# Patient Record
Sex: Male | Born: 1970 | Race: White | Hispanic: No | Marital: Married | Smoking: Current every day smoker
Health system: Southern US, Community
[De-identification: ages and names within clinical notes are randomized; demographics above are authoritative.]

## PROBLEM LIST (undated history)

## (undated) DIAGNOSIS — R079 Chest pain, unspecified: Secondary | ICD-10-CM

## (undated) DIAGNOSIS — K219 Gastro-esophageal reflux disease without esophagitis: Secondary | ICD-10-CM

## (undated) DIAGNOSIS — Z8719 Personal history of other diseases of the digestive system: Secondary | ICD-10-CM

## (undated) HISTORY — DX: Chest pain, unspecified: R07.9

## (undated) HISTORY — PX: FOOT SURGERY: SHX648

## (undated) HISTORY — DX: Gastro-esophageal reflux disease without esophagitis: K21.9

## (undated) HISTORY — PX: CHOLECYSTECTOMY: SHX55

---

## 1998-05-11 ENCOUNTER — Emergency Department (HOSPITAL_COMMUNITY): Admission: EM | Admit: 1998-05-11 | Discharge: 1998-05-11 | Payer: Self-pay | Admitting: Emergency Medicine

## 2001-05-19 ENCOUNTER — Ambulatory Visit (HOSPITAL_COMMUNITY): Admission: RE | Admit: 2001-05-19 | Discharge: 2001-05-19 | Payer: Self-pay | Admitting: Infectious Diseases

## 2001-05-19 ENCOUNTER — Encounter: Admission: RE | Admit: 2001-05-19 | Discharge: 2001-05-19 | Payer: Self-pay | Admitting: Infectious Diseases

## 2001-06-23 ENCOUNTER — Encounter: Admission: RE | Admit: 2001-06-23 | Discharge: 2001-06-23 | Payer: Self-pay | Admitting: Infectious Diseases

## 2005-03-22 ENCOUNTER — Emergency Department (HOSPITAL_COMMUNITY): Admission: AD | Admit: 2005-03-22 | Discharge: 2005-03-22 | Payer: Self-pay | Admitting: Family Medicine

## 2007-07-09 ENCOUNTER — Encounter: Payer: Self-pay | Admitting: Pulmonary Disease

## 2007-07-22 ENCOUNTER — Ambulatory Visit: Payer: Self-pay | Admitting: Pulmonary Disease

## 2007-07-22 DIAGNOSIS — R0602 Shortness of breath: Secondary | ICD-10-CM

## 2007-07-22 DIAGNOSIS — J45909 Unspecified asthma, uncomplicated: Secondary | ICD-10-CM | POA: Insufficient documentation

## 2008-06-27 ENCOUNTER — Encounter: Admission: RE | Admit: 2008-06-27 | Discharge: 2008-06-27 | Payer: Self-pay | Admitting: Family Medicine

## 2008-11-22 ENCOUNTER — Emergency Department (HOSPITAL_COMMUNITY): Admission: EM | Admit: 2008-11-22 | Discharge: 2008-11-22 | Payer: Self-pay | Admitting: Emergency Medicine

## 2010-01-24 ENCOUNTER — Ambulatory Visit (HOSPITAL_COMMUNITY): Admission: RE | Admit: 2010-01-24 | Discharge: 2010-01-24 | Payer: Self-pay | Admitting: Family Medicine

## 2010-07-10 ENCOUNTER — Institutional Professional Consult (permissible substitution): Payer: Self-pay | Admitting: Cardiology

## 2010-08-12 ENCOUNTER — Telehealth: Payer: Self-pay | Admitting: Cardiology

## 2010-08-12 NOTE — Telephone Encounter (Signed)
07/10/2010 NEEDS OFFICE NOTE AND ANY TESTING  Jeffery Thompson [phone]

## 2011-07-08 ENCOUNTER — Encounter (HOSPITAL_COMMUNITY): Payer: Self-pay | Admitting: *Deleted

## 2011-07-08 ENCOUNTER — Ambulatory Visit (HOSPITAL_COMMUNITY)
Admission: EM | Admit: 2011-07-08 | Discharge: 2011-07-09 | Disposition: A | Discharge status: Home / Self Care | Payer: BC Managed Care – PPO | Attending: Surgery | Admitting: Surgery

## 2011-07-08 ENCOUNTER — Emergency Department (HOSPITAL_COMMUNITY)
Admission: EM | Admit: 2011-07-08 | Discharge: 2011-07-08 | Disposition: A | Discharge status: Other Acute Inpatient Hospital | Payer: BC Managed Care – PPO | Source: Home / Self Care | Attending: Family Medicine | Admitting: Family Medicine

## 2011-07-08 ENCOUNTER — Emergency Department (HOSPITAL_COMMUNITY): Payer: BC Managed Care – PPO

## 2011-07-08 DIAGNOSIS — R109 Unspecified abdominal pain: Secondary | ICD-10-CM

## 2011-07-08 DIAGNOSIS — K449 Diaphragmatic hernia without obstruction or gangrene: Secondary | ICD-10-CM | POA: Insufficient documentation

## 2011-07-08 DIAGNOSIS — K3580 Unspecified acute appendicitis: Secondary | ICD-10-CM | POA: Insufficient documentation

## 2011-07-08 HISTORY — DX: Personal history of other diseases of the digestive system: Z87.19

## 2011-07-08 LAB — CBC
HCT: 52.1 % — ABNORMAL HIGH (ref 39.0–52.0)
Hemoglobin: 18.5 g/dL — ABNORMAL HIGH (ref 13.0–17.0)
MCV: 89.4 fL (ref 78.0–100.0)
RBC: 5.83 MIL/uL — ABNORMAL HIGH (ref 4.22–5.81)
WBC: 14.9 10*3/uL — ABNORMAL HIGH (ref 4.0–10.5)

## 2011-07-08 LAB — DIFFERENTIAL
Basophils Relative: 0 % (ref 0–1)
Eosinophils Absolute: 0.4 10*3/uL (ref 0.0–0.7)
Eosinophils Relative: 3 % (ref 0–5)
Neutrophils Relative %: 64 % (ref 43–77)

## 2011-07-08 LAB — POCT URINALYSIS DIP (DEVICE)
Bilirubin Urine: NEGATIVE
Ketones, ur: NEGATIVE mg/dL
Leukocytes, UA: NEGATIVE
Specific Gravity, Urine: 1.01 (ref 1.005–1.030)

## 2011-07-08 LAB — POCT I-STAT, CHEM 8
HCT: 54 % — ABNORMAL HIGH (ref 39.0–52.0)
Hemoglobin: 18.4 g/dL — ABNORMAL HIGH (ref 13.0–17.0)
Potassium: 3.8 mEq/L (ref 3.5–5.1)
Sodium: 140 mEq/L (ref 135–145)
TCO2: 25 mmol/L (ref 0–100)

## 2011-07-08 MED ORDER — IOHEXOL 300 MG/ML  SOLN
100.0000 mL | Freq: Once | INTRAMUSCULAR | Status: AC | PRN
  Administered 2011-07-08: 100 mL via INTRAVENOUS

## 2011-07-08 MED ORDER — HYDROMORPHONE HCL PF 1 MG/ML IJ SOLN
1.0000 mg | Freq: Once | INTRAMUSCULAR | Status: AC
  Administered 2011-07-08: 1 mg via INTRAVENOUS
  Filled 2011-07-08: qty 1

## 2011-07-08 MED ORDER — ONDANSETRON HCL 4 MG/2ML IJ SOLN
4.0000 mg | Freq: Once | INTRAMUSCULAR | Status: AC
  Administered 2011-07-08: 4 mg via INTRAVENOUS
  Filled 2011-07-08: qty 2

## 2011-07-08 NOTE — ED Notes (Signed)
Checked with pt. Raised bed for comfort measure ,family already had coffee . Everything ok right now waiting for md .Advised to let me know if they need anything.

## 2011-07-08 NOTE — ED Provider Notes (Signed)
History     CSN: [id number]  Arrival date & time 07/08/11  1423   First MD Initiated Contact with Patient 07/08/11 1523      Chief Complaint  Patient presents with  . Abdominal Pain    (Consider location/radiation/quality/duration/timing/severity/associated sxs/prior treatment) HPI Comments: Jeffery Thompson presents for evaluation of sudden onset of right lower quadrant abdominal pain. He reports onset of symptoms around 4:00 this morning. He reports nausea, but no vomiting. He had a normal bowel movement this morning. He denies any urinary symptoms. He states that the pain does not radiate. He has tolerated by mouth today. He is a history of a cholecystectomy. On examination at the urgent care Center. He is tended over McBurney's point with rebound tenderness. There is no increase in pain with activation of the iliopsoas.  Patient is a 41 y.o. male presenting with abdominal pain. The history is provided by the patient.  Abdominal Pain The primary symptoms of the illness include abdominal pain and nausea. The primary symptoms of the illness do not include fever, vomiting, diarrhea or dysuria. The current episode started 6 to 12 hours ago. The onset of the illness was sudden. The problem has not changed since onset. The abdominal pain began 6 to 12 hours ago. The pain came on suddenly. The abdominal pain has been unchanged since its onset. The abdominal pain is located in the RLQ. The abdominal pain does not radiate. The severity of the abdominal pain is 9/10. The abdominal pain is relieved by nothing.  Nausea began today.    Past Medical History  Diagnosis Date  . Low testosterone   . Hiatal hernia     Past Surgical History  Procedure Date  . Cholecystectomy     No family history on file.  History  Substance Use Topics  . Smoking status: Current Everyday Smoker  . Smokeless tobacco: Not on file  . Alcohol Use: Yes      Review of Systems  Constitutional: Negative.  Negative for  fever.  HENT: Negative.   Eyes: Negative.   Respiratory: Negative.   Cardiovascular: Negative.   Gastrointestinal: Positive for nausea and abdominal pain. Negative for vomiting and diarrhea.  Genitourinary: Negative.  Negative for dysuria.  Musculoskeletal: Negative.   Skin: Negative.   Neurological: Negative.     Allergies  Tetracycline  Home Medications   Current Outpatient Rx  Name Route Sig Dispense Refill  . LORATADINE 10 MG PO TABS Oral Take 10 mg by mouth daily.    Marland Kitchen OMEPRAZOLE 20 MG PO CPDR Oral Take 20 mg by mouth daily.    . TESTOSTERONE 50 MG/5GM TD GEL Transdermal Place 5 g onto the skin daily.      BP 148/92  Pulse 69  Temp(Src) 98.6 F (37 C) (Oral)  Resp 18  SpO2 98%  Physical Exam  Nursing note and vitals reviewed. Constitutional: He is oriented to person, place, and time. He appears well-developed and well-nourished.  HENT:  Head: Normocephalic and atraumatic.  Eyes: EOM are normal.  Neck: Normal range of motion.  Pulmonary/Chest: Effort normal.  Abdominal: Soft. Normal appearance and bowel sounds are normal. There is tenderness in the right lower quadrant. There is rebound and tenderness at McBurney's point. There is no rigidity and no guarding.  Musculoskeletal: Normal range of motion.  Neurological: He is alert and oriented to person, place, and time.  Skin: Skin is warm and dry.  Psychiatric: His behavior is normal.    ED Course  Procedures (including critical  care time)   Labs Reviewed  POCT URINALYSIS DIP (DEVICE)   No results found.   1. Abdominal pain       MDM  Transferred to [facility] Emergency Department to rule out appendicitis.        Richardo Priest, MD 07/08/11 [phone]

## 2011-07-08 NOTE — ED Notes (Signed)
Pt  Reports  r  sided  abd  Pain      Onset  This  Am      Had  Normal  bm today        Walking  Upright  With  Fluid  Gait          denys  Any  Urinary  Symptoms        Skin is  Warm  Dry  No  Active  Vomiting  At this  Time

## 2011-07-08 NOTE — ED Notes (Signed)
NP at bedside.

## 2011-07-08 NOTE — ED Notes (Signed)
Patient reports onset of right sided pain that started this morning and has increased during the day.  Patient was sent for further eval of possible appendicitis

## 2011-07-08 NOTE — ED Provider Notes (Signed)
History     CSN: [id number]  Arrival date & time 07/08/11  1830   First MD Initiated Contact with Patient 07/08/11 2057      Chief Complaint  Patient presents with  . Abdominal Pain    (Consider location/radiation/quality/duration/timing/severity/associated sxs/prior treatment) HPI Comments: The patient woke this morning with right lower and suprapubic pain, which has progressed throughout the day with nausea one loose stool.   To an urgent care they did a urine and sent him here after his physical exam, they did not perform any lab values  Patient is a 41 y.o. male presenting with abdominal pain. The history is provided by the patient.  Abdominal Pain The primary symptoms of the illness include abdominal pain and nausea. The primary symptoms of the illness do not include fever, vomiting or dysuria. The current episode started 13 to 24 hours ago. The onset of the illness was gradual. The problem has been gradually worsening.  Symptoms associated with the illness do not include chills, anorexia, constipation, urgency or frequency.    Past Medical History  Diagnosis Date  . Low testosterone   . Hiatal hernia     Past Surgical History  Procedure Date  . Cholecystectomy     No family history on file.  History  Substance Use Topics  . Smoking status: Current Everyday Smoker  . Smokeless tobacco: Not on file  . Alcohol Use: Yes      Review of Systems  Constitutional: Negative for fever and chills.  Gastrointestinal: Positive for nausea and abdominal pain. Negative for vomiting, constipation, rectal pain and anorexia.  Genitourinary: Negative for dysuria, urgency and frequency.  Neurological: Negative for dizziness.    Allergies  Tetracycline  Home Medications   Current Outpatient Rx  Name Route Sig Dispense Refill  . CLOBETASOL PROPIONATE 0.05 % EX CREA Topical Apply 1 application topically 2 (two) times daily.    Marland Kitchen LORATADINE 10 MG PO TABS Oral Take 10 mg by  mouth daily as needed. For seasonal allergies    . OMEPRAZOLE 20 MG PO CPDR Oral Take 20 mg by mouth daily before breakfast.     . TESTOSTERONE 50 MG/5GM TD GEL Transdermal Place 5 g onto the skin daily. Applies to shoulders      BP 131/82  Pulse 74  Temp(Src) 98.4 F (36.9 C) (Oral)  Resp 16  Ht 5\' 9"  (1.753 m)  Wt 203 lb (92.08 kg)  BMI 29.98 kg/m2  SpO2 97%  Physical Exam  Constitutional: He is oriented to person, place, and time. He appears well-developed and well-nourished.  Eyes: Pupils are equal, round, and reactive to light.  Cardiovascular: Normal rate.   Abdominal: He exhibits no distension and no mass. There is tenderness. There is no guarding.    Musculoskeletal: Normal range of motion.  Neurological: He is alert and oriented to person, place, and time.  Skin: Skin is warm and dry.    ED Course  Procedures (including critical care time)  Labs Reviewed  CBC - Abnormal; Notable for the following:    WBC 14.9 (*)    RBC 5.83 (*)    Hemoglobin 18.5 (*)    HCT 52.1 (*)    All other components within normal limits  DIFFERENTIAL - Abnormal; Notable for the following:    Neutro Abs 9.5 (*)    Monocytes Absolute 1.2 (*)    All other components within normal limits  POCT I-STAT, CHEM 8 - Abnormal; Notable for the following:  Hemoglobin 18.4 (*)    HCT 54.0 (*)    All other components within normal limits   Ct Abdomen Pelvis W Contrast  07/09/2011  *RADIOLOGY REPORT*  Clinical Data: Right lower quadrant pain. Elevated white blood count.  CT ABDOMEN AND PELVIS WITH CONTRAST  Technique:  Multidetector CT imaging of the abdomen and pelvis was performed following the standard protocol during bolus administration of intravenous contrast.  Contrast:  100 ml Omnipaque-300  Comparison: None.  Findings: Lung bases are clear.  No evidence for free air. Decreased attenuation of the liver suggests hepatic steatosis.  The portal venous system is patent.  Normal appearance of the  kidneys, adrenal glands and spleen.  No significant free fluid or lymphadenopathy in the abdomen or pelvis.  No gross abnormality to the prostate or seminal vesicles.  Urinary bladder is mildly distended.  The appendix is mildly distended measuring up to 9 mm.  The appendix is most distended near the tip.  There is mild stranding around the appendix, best seen on the sagittal images.  The distal appendix appears to be fluid filled.  No acute bony abnormality.  IMPRESSION:  The appendix is mildly distended and there are subtle inflammatory changes around the appendix.  Findings are concerning for an early appendicitis.  Critical Value/emergent results were called by telephone at the time of interpretation on 07/09/2011  at 12:15 a.m.  to  Earley Favor, who verbally acknowledged these results.  Original Report Authenticated By: Richarda Overlie, M.D.     1. Acute appendicitis       MDM  Will obtain labs including CBC and i-STAT provide pain management and obtain a CT scan to evaluate for right lower quadrant pain concerning for appendicitis  Radiologist called confirming early acute appendicitis      Arman Filter, NP 07/08/11 2237  Arman Filter, NP 07/09/11 0017  Arman Filter, NP 07/09/11 1610

## 2011-07-08 NOTE — Discharge Instructions (Signed)
Transferred to [facility] Emergency Department to rule out appendicitis.

## 2011-07-09 ENCOUNTER — Encounter (HOSPITAL_COMMUNITY): Payer: Self-pay | Admitting: Certified Registered Nurse Anesthetist

## 2011-07-09 ENCOUNTER — Emergency Department (HOSPITAL_COMMUNITY): Payer: BC Managed Care – PPO | Admitting: Certified Registered Nurse Anesthetist

## 2011-07-09 ENCOUNTER — Encounter (HOSPITAL_COMMUNITY): Payer: Self-pay | Admitting: *Deleted

## 2011-07-09 ENCOUNTER — Encounter (HOSPITAL_COMMUNITY)
Admission: EM | Disposition: A | Discharge status: Home / Self Care | Payer: Self-pay | Source: Home / Self Care | Attending: Emergency Medicine

## 2011-07-09 DIAGNOSIS — K3580 Unspecified acute appendicitis: Secondary | ICD-10-CM

## 2011-07-09 DIAGNOSIS — R112 Nausea with vomiting, unspecified: Secondary | ICD-10-CM

## 2011-07-09 DIAGNOSIS — R1031 Right lower quadrant pain: Secondary | ICD-10-CM

## 2011-07-09 HISTORY — PX: LAPAROSCOPIC APPENDECTOMY: SHX408

## 2011-07-09 LAB — CBC
HCT: 46.3 % (ref 39.0–52.0)
Hemoglobin: 15.9 g/dL (ref 13.0–17.0)
MCHC: 34.3 g/dL (ref 30.0–36.0)
RBC: 5.15 MIL/uL (ref 4.22–5.81)

## 2011-07-09 LAB — CREATININE, SERUM
Creatinine, Ser: 0.96 mg/dL (ref 0.50–1.35)
GFR calc Af Amer: >90 mL/min (ref >90)
GFR calc non Af Amer: >90 mL/min (ref >90)

## 2011-07-09 SURGERY — APPENDECTOMY, LAPAROSCOPIC
Anesthesia: General | Site: Abdomen | Wound class: Clean

## 2011-07-09 MED ORDER — CLOBETASOL PROPIONATE 0.05 % EX CREA
1.0000 "application " | TOPICAL_CREAM | Freq: Two times a day (BID) | CUTANEOUS | Status: DC
  Filled 2011-07-09: qty 15

## 2011-07-09 MED ORDER — ACETAMINOPHEN 325 MG PO TABS: 650.0000 mg | ORAL_TABLET | ORAL | Status: DC | PRN

## 2011-07-09 MED ORDER — ROCURONIUM BROMIDE 100 MG/10ML IV SOLN
INTRAVENOUS | Status: DC | PRN
  Administered 2011-07-09: 50 mg via INTRAVENOUS

## 2011-07-09 MED ORDER — KCL IN DEXTROSE-NACL 20-5-0.45 MEQ/L-%-% IV SOLN
INTRAVENOUS | Status: DC
  Administered 2011-07-09: 06:00:00 via INTRAVENOUS
  Filled 2011-07-09 (×2): qty 1000

## 2011-07-09 MED ORDER — MIDAZOLAM HCL 5 MG/5ML IJ SOLN
INTRAMUSCULAR | Status: DC | PRN
  Administered 2011-07-09: 2 mg via INTRAVENOUS

## 2011-07-09 MED ORDER — PANTOPRAZOLE SODIUM 40 MG PO TBEC
40.0000 mg | DELAYED_RELEASE_TABLET | Freq: Every day | ORAL | Status: DC
  Administered 2011-07-09: 40 mg via ORAL
  Filled 2011-07-09: qty 1

## 2011-07-09 MED ORDER — HYDROCODONE-ACETAMINOPHEN 5-325 MG PO TABS: 1.0000 | ORAL_TABLET | ORAL | Status: AC | PRN

## 2011-07-09 MED ORDER — KETOROLAC TROMETHAMINE 30 MG/ML IJ SOLN
30.0000 mg | Freq: Four times a day (QID) | INTRAMUSCULAR | Status: DC
  Administered 2011-07-09: 30 mg via INTRAVENOUS
  Filled 2011-07-09 (×3): qty 1

## 2011-07-09 MED ORDER — HYDROMORPHONE HCL PF 1 MG/ML IJ SOLN
0.2500 mg | INTRAMUSCULAR | Status: DC | PRN
  Administered 2011-07-09: 0.5 mg via INTRAVENOUS

## 2011-07-09 MED ORDER — TESTOSTERONE 50 MG/5GM (1%) TD GEL: 5.0000 g | Freq: Every day | TRANSDERMAL | Status: DC

## 2011-07-09 MED ORDER — SODIUM CHLORIDE 0.9 % IV SOLN
1.0000 g | INTRAVENOUS | Status: AC
  Administered 2011-07-09: 1 g via INTRAVENOUS
  Filled 2011-07-09: qty 1

## 2011-07-09 MED ORDER — EPHEDRINE SULFATE 50 MG/ML IJ SOLN
INTRAMUSCULAR | Status: DC | PRN
  Administered 2011-07-09: 5 mg via INTRAVENOUS

## 2011-07-09 MED ORDER — PROPOFOL 10 MG/ML IV EMUL
INTRAVENOUS | Status: DC | PRN
  Administered 2011-07-09: 200 mg via INTRAVENOUS

## 2011-07-09 MED ORDER — MEPERIDINE HCL 25 MG/ML IJ SOLN: 6.2500 mg | INTRAMUSCULAR | Status: DC | PRN

## 2011-07-09 MED ORDER — MORPHINE SULFATE 2 MG/ML IJ SOLN
1.0000 mg | INTRAMUSCULAR | Status: DC | PRN
  Administered 2011-07-09: 1 mg via INTRAVENOUS
  Filled 2011-07-09: qty 1

## 2011-07-09 MED ORDER — ONDANSETRON HCL 4 MG/2ML IJ SOLN: 4.0000 mg | Freq: Once | INTRAMUSCULAR | Status: DC | PRN

## 2011-07-09 MED ORDER — MORPHINE SULFATE 4 MG/ML IJ SOLN: 0.0500 mg/kg | INTRAMUSCULAR | Status: DC | PRN

## 2011-07-09 MED ORDER — ONDANSETRON HCL 4 MG/2ML IJ SOLN
INTRAMUSCULAR | Status: DC | PRN
  Administered 2011-07-09: 4 mg via INTRAVENOUS

## 2011-07-09 MED ORDER — NEOSTIGMINE METHYLSULFATE 1 MG/ML IJ SOLN
INTRAMUSCULAR | Status: DC | PRN
  Administered 2011-07-09: 5 mg via INTRAVENOUS

## 2011-07-09 MED ORDER — FENTANYL CITRATE 0.05 MG/ML IJ SOLN
INTRAMUSCULAR | Status: DC | PRN
  Administered 2011-07-09: 150 ug via INTRAVENOUS

## 2011-07-09 MED ORDER — GLYCOPYRROLATE 0.2 MG/ML IJ SOLN
INTRAMUSCULAR | Status: DC | PRN
  Administered 2011-07-09: .8 mg via INTRAVENOUS

## 2011-07-09 MED ORDER — BUPIVACAINE-EPINEPHRINE 0.25% -1:200000 IJ SOLN
INTRAMUSCULAR | Status: DC | PRN
  Administered 2011-07-09: 7 mL

## 2011-07-09 MED ORDER — HYDROCODONE-ACETAMINOPHEN 5-325 MG PO TABS
1.0000 | ORAL_TABLET | ORAL | Status: DC | PRN
  Administered 2011-07-09: 2 via ORAL
  Filled 2011-07-09: qty 2

## 2011-07-09 MED ORDER — HYDROMORPHONE HCL PF 1 MG/ML IJ SOLN
1.0000 mg | Freq: Once | INTRAMUSCULAR | Status: DC
  Filled 2011-07-09: qty 1

## 2011-07-09 MED ORDER — ENOXAPARIN SODIUM 40 MG/0.4ML ~~LOC~~ SOLN
40.0000 mg | Freq: Every day | SUBCUTANEOUS | Status: DC
  Administered 2011-07-09: 40 mg via SUBCUTANEOUS
  Filled 2011-07-09: qty 0.4

## 2011-07-09 MED ORDER — LIDOCAINE HCL (CARDIAC) 20 MG/ML IV SOLN
INTRAVENOUS | Status: DC | PRN
  Administered 2011-07-09: 100 mg via INTRAVENOUS

## 2011-07-09 MED ORDER — LORATADINE 10 MG PO TABS
10.0000 mg | ORAL_TABLET | Freq: Every day | ORAL | Status: DC | PRN
  Filled 2011-07-09: qty 1

## 2011-07-09 MED ORDER — OXYCODONE-ACETAMINOPHEN 5-325 MG PO TABS
1.0000 | ORAL_TABLET | ORAL | Status: DC | PRN
  Administered 2011-07-09: 2 via ORAL
  Filled 2011-07-09: qty 2

## 2011-07-09 MED ORDER — LACTATED RINGERS IV SOLN
INTRAVENOUS | Status: DC | PRN
  Administered 2011-07-09: 02:00:00 via INTRAVENOUS

## 2011-07-09 SURGICAL SUPPLY — 39 items
APPLIER CLIP ROT 10 11.4 M/L (STAPLE)
BLADE SURG ROTATE 9660 (MISCELLANEOUS) IMPLANT
CANISTER SUCTION 2500CC (MISCELLANEOUS) ×2 IMPLANT
CHLORAPREP W/TINT 26ML (MISCELLANEOUS) ×2 IMPLANT
CLIP APPLIE ROT 10 11.4 M/L (STAPLE) IMPLANT
CLOTH BEACON ORANGE TIMEOUT ST (SAFETY) ×2 IMPLANT
COVER SURGICAL LIGHT HANDLE (MISCELLANEOUS) ×2 IMPLANT
CUTTER FLEX LINEAR 45M (STAPLE) ×2 IMPLANT
DERMABOND ADVANCED (GAUZE/BANDAGES/DRESSINGS) ×1
DERMABOND ADVANCED .7 DNX12 (GAUZE/BANDAGES/DRESSINGS) ×1 IMPLANT
DRAPE UTILITY 15X26 W/TAPE STR (DRAPE) ×4 IMPLANT
DRAPE WARM FLUID 44X44 (DRAPE) ×2 IMPLANT
ELECT REM PT RETURN 9FT ADLT (ELECTROSURGICAL) ×2
ELECTRODE REM PT RTRN 9FT ADLT (ELECTROSURGICAL) ×1 IMPLANT
ENDOLOOP SUT PDS II  0 18 (SUTURE)
ENDOLOOP SUT PDS II 0 18 (SUTURE) IMPLANT
GLOVE BIO SURGEON STRL SZ 6 (GLOVE) ×2 IMPLANT
GLOVE BIOGEL PI IND STRL 6.5 (GLOVE) ×1 IMPLANT
GLOVE BIOGEL PI INDICATOR 6.5 (GLOVE) ×1
GOWN PREVENTION PLUS XXLARGE (GOWN DISPOSABLE) ×4 IMPLANT
GOWN STRL NON-REIN LRG LVL3 (GOWN DISPOSABLE) ×4 IMPLANT
KIT BASIN OR (CUSTOM PROCEDURE TRAY) ×2 IMPLANT
KIT ROOM TURNOVER OR (KITS) ×2 IMPLANT
NS IRRIG 1000ML POUR BTL (IV SOLUTION) ×2 IMPLANT
PAD ARMBOARD 7.5X6 YLW CONV (MISCELLANEOUS) ×4 IMPLANT
POUCH SPECIMEN RETRIEVAL 10MM (ENDOMECHANICALS) ×2 IMPLANT
RELOAD STAPLE TA45 3.5 REG BLU (ENDOMECHANICALS) ×2 IMPLANT
SCALPEL HARMONIC ACE (MISCELLANEOUS) ×2 IMPLANT
SET IRRIG TUBING LAPAROSCOPIC (IRRIGATION / IRRIGATOR) ×2 IMPLANT
SLEEVE ENDOPATH XCEL 5M (ENDOMECHANICALS) ×2 IMPLANT
SPECIMEN JAR SMALL (MISCELLANEOUS) ×2 IMPLANT
SUT MNCRL AB 4-0 PS2 18 (SUTURE) ×2 IMPLANT
TOWEL OR 17X24 6PK STRL BLUE (TOWEL DISPOSABLE) ×2 IMPLANT
TOWEL OR 17X26 10 PK STRL BLUE (TOWEL DISPOSABLE) ×4 IMPLANT
TRAY FOLEY CATH 14FR (SET/KITS/TRAYS/PACK) ×2 IMPLANT
TRAY LAPAROSCOPIC (CUSTOM PROCEDURE TRAY) ×2 IMPLANT
TROCAR XCEL BLUNT TIP 100MML (ENDOMECHANICALS) ×2 IMPLANT
TROCAR XCEL NON-BLD 5MMX100MML (ENDOMECHANICALS) ×2 IMPLANT
WATER STERILE IRR 1000ML POUR (IV SOLUTION) IMPLANT

## 2011-07-09 NOTE — Progress Notes (Signed)
Day of Surgery  Subjective: Eating clears, and ordering Bagel. Feels fine this AM.  Just took PO pain med.   Objective: Vital signs in last 24 hours: Temp:  [97.4 F (36.3 C)-98.6 F (37 C)] 98 F (36.7 C) (02/27 0555) Pulse Rate:  [69-102] 78  (02/27 0555) Resp:  [14-20] 18  (02/27 0555) BP: (113-154)/(56-96) 113/56 mmHg (02/27 0555) SpO2:  [94 %-98 %] 96 % (02/27 0555) Weight:  [92.08 kg (203 lb)] 92.08 kg (203 lb) (02/26 1845) Last BM Date: 07/08/11  Intake/Output from previous day: 02/26 0701 - 02/27 0700 In: 1660 [P.O.:360; I.V.:1300] Out: 605 [Urine:450; Blood:5] Intake/Output this shift:    PE:  Alert, Chest: clear  ABD:  Still somewhat tender, +BS, Incisions look good.   Lab Results:   Basename 07/09/11 0630 07/08/11 2124 07/08/11 2105  WBC 12.7* -- 14.9*  HGB 15.9 18.4* --  HCT 46.3 54.0* --  PLT 174 -- 205    BMET  Basename 07/09/11 0630 07/08/11 2124  NA -- 140  K -- 3.8  CL -- 103  CO2 -- --  GLUCOSE -- 90  BUN -- 9  CREATININE 0.96 0.90  CALCIUM -- --   PT/INR No results found for this basename: LABPROT:2,INR:2 in the last 72 hours   Studies/Results: Ct Abdomen Pelvis W Contrast  07/09/2011  *RADIOLOGY REPORT*  Clinical Data: Right lower quadrant pain. Elevated white blood count.  CT ABDOMEN AND PELVIS WITH CONTRAST  Technique:  Multidetector CT imaging of the abdomen and pelvis was performed following the standard protocol during bolus administration of intravenous contrast.  Contrast:  100 ml Omnipaque-300  Comparison: None.  Findings: Lung bases are clear.  No evidence for free air. Decreased attenuation of the liver suggests hepatic steatosis.  The portal venous system is patent.  Normal appearance of the kidneys, adrenal glands and spleen.  No significant free fluid or lymphadenopathy in the abdomen or pelvis.  No gross abnormality to the prostate or seminal vesicles.  Urinary bladder is mildly distended.  The appendix is mildly distended  measuring up to 9 mm.  The appendix is most distended near the tip.  There is mild stranding around the appendix, best seen on the sagittal images.  The distal appendix appears to be fluid filled.  No acute bony abnormality.  IMPRESSION:  The appendix is mildly distended and there are subtle inflammatory changes around the appendix.  Findings are concerning for an early appendicitis.  Critical Value/emergent results were called by telephone at the time of interpretation on 07/09/2011  at 12:15 a.m.  to  Earley Favor, who verbally acknowledged these results.  Original Report Authenticated By: Richarda Overlie, M.D.    Anti-infectives: Anti-infectives     Start     Dose/Rate Route Frequency Ordered Stop   07/09/11 0200   ertapenem (INVANZ) 1 g in sodium chloride 0.9 % 50 mL IVPB        1 g 100 mL/hr over 30 Minutes Intravenous To Surgery 07/09/11 0118 07/09/11 0215         Current Facility-Administered Medications  Medication Dose Route Frequency Provider Last Rate Last Dose  . acetaminophen (TYLENOL) tablet 650 mg  650 mg Oral Q4H PRN Almond Lint, MD      . clobetasol cream (TEMOVATE) 0.05 % 1 application  1 application Topical BID Almond Lint, MD      . dextrose 5 % and 0.45 % NaCl with KCl 20 mEq/L infusion   Intravenous Continuous Almond Lint, MD 100  mL/hr at 07/09/11 9562    . enoxaparin (LOVENOX) injection 40 mg  40 mg Subcutaneous Daily Almond Lint, MD      . ertapenem [facility]) 1 g in sodium chloride 0.9 % 50 mL IVPB  1 g Intravenous To OR Almond Lint, MD   1 g at 07/09/11 0215  . HYDROcodone-acetaminophen (NORCO) 5-325 MG per tablet 1-2 tablet  1-2 tablet Oral Q4H PRN Almond Lint, MD   2 tablet at 07/09/11 0804  . HYDROmorphone (DILAUDID) injection 1 mg  1 mg Intravenous Once Arman Filter, NP   1 mg at 07/08/11 2109  . HYDROmorphone (DILAUDID) injection 1 mg  1 mg Intravenous Once Arman Filter, NP   1 mg at 07/08/11 2228  . iohexol (OMNIPAQUE) 300 MG/ML solution 100 mL  100 mL  Intravenous Once PRN Medication Radiologist, MD   100 mL at 07/08/11 2355  . ketorolac (TORADOL) 30 MG/ML injection 30 mg  30 mg Intravenous Q6H Almond Lint, MD   30 mg at 07/09/11 0326  . loratadine (CLARITIN) tablet 10 mg  10 mg Oral Daily PRN Almond Lint, MD      . morphine 2 MG/ML injection 1-2 mg  1-2 mg Intravenous Q2H PRN Almond Lint, MD   1 mg at 07/09/11 0443  . ondansetron (ZOFRAN) injection 4 mg  4 mg Intravenous Once Arman Filter, NP   4 mg at 07/08/11 2109  . pantoprazole (PROTONIX) EC tablet 40 mg  40 mg Oral Q1200 Almond Lint, MD   40 mg at 07/09/11 0759  . testosterone (ANDROGEL) gel 5 g  5 g Transdermal Daily Almond Lint, MD      . DISCONTD: bupivacaine-EPINEPHrine (MARCAINE W/ EPI) 0.25 % (with pres) injection    PRN Almond Lint, MD   7 mL at 07/09/11 0258  . DISCONTD: HYDROmorphone (DILAUDID) injection 0.25-0.5 mg  0.25-0.5 mg Intravenous Q5 min PRN Kerby Nora, MD   0.5 mg at 07/09/11 0330  . DISCONTD: HYDROmorphone (DILAUDID) injection 1 mg  1 mg Intravenous Once Arman Filter, NP      . DISCONTD: meperidine (DEMEROL) injection 6.25-12.5 mg  6.25-12.5 mg Intravenous Q5 min PRN Kerby Nora, MD      . DISCONTD: morphine 4 MG/ML injection 4.6 mg  0.05 mg/kg Intravenous Q10 min PRN Kerby Nora, MD      . DISCONTD: ondansetron [facility]) injection 4 mg  4 mg Intravenous Once PRN Kerby Nora, MD       Facility-Administered Medications Ordered in Other Encounters  Medication Dose Route Frequency Provider Last Rate Last Dose  . DISCONTD: ePHEDrine injection    PRN Delbert Harness, CRNA   5 mg at 07/09/11 0215  . DISCONTD: fentaNYL (SUBLIMAZE) injection    PRN Delbert Harness, CRNA   150 mcg at 07/09/11 0158  . DISCONTD: glycopyrrolate (ROBINUL) injection    PRN Delbert Harness, CRNA   0.8 mg at 07/09/11 0251  . DISCONTD: lactated ringers infusion    Continuous PRN Delbert Harness, CRNA      . DISCONTD: lidocaine (cardiac) 100 mg/35ml (XYLOCAINE) 20 MG/ML injection 2%    PRN  Delbert Harness, CRNA   100 mg at 07/09/11 0159  . DISCONTD: midazolam (VERSED) 5 MG/5ML injection    PRN Delbert Harness, CRNA   2 mg at 07/09/11 0154  . DISCONTD: neostigmine (PROSTIGMINE) injection   Intravenous PRN Delbert Harness, CRNA   5 mg at 07/09/11 0251  .  DISCONTD: ondansetron (ZOFRAN) injection    PRN Delbert Harness, CRNA   4 mg at 07/09/11 0154  . DISCONTD: propofol (DIPRIVAN) 10 MG/ML infusion    PRN Delbert Harness, CRNA   200 mg at 07/09/11 0159  . DISCONTD: rocuronium (ZEMURON) injection    PRN Delbert Harness, CRNA   50 mg at 07/09/11 0159    Assessment/Plan Acute appendicitis without mention of peritonitis s/p Appendectomy, Lap 07/09/11 DR.  Byerly Low testosterone  .  Hiatal hernia   PLan:  Advance diet, mobilize and may get home later today.    LOS: 1 day    Laniqua Torrens 07/09/2011

## 2011-07-09 NOTE — H&P (Signed)
Jeffery Thompson is an 41 y.o. male.   Chief Complaint: Abdominal pain HPI:  Pt is 41 year old male who awoke with right sided abdominal pain.  He thought it was gas, but it gradually worsened over the course of the day.  He denies anorexia, nausea, vomiting, diarrhea.  He denies fever/ chills.  He has not had pain like this before.  It hurts worse when he moves.   Past Medical History  Diagnosis Date  . Low testosterone   . Hiatal hernia     Past Surgical History  Procedure Date  . Cholecystectomy     No family history on file. Social History:  reports that he has been smoking.  He does not have any smokeless tobacco history on file. He reports that he drinks alcohol. He reports that he does not use illicit drugs.  Allergies:  Allergies  Allergen Reactions  . Tetracycline     REACTION: rash    Prescriptions Show Facility-Administered Medications   clobetasol cream (TEMOVATE) 0.05 %  loratadine (CLARITIN) 10 MG tablet  omeprazole (PRILOSEC) 20 MG capsule  testosterone (ANDROGEL) 50 MG/5GM GEL     Results for orders placed during the hospital encounter of 07/08/11 (from the past 48 hour(s))  CBC     Status: Abnormal   Collection Time   07/08/11  9:05 PM      Component Value Range Comment   WBC 14.9 (*) 4.0 - 10.5 (K/uL)    RBC 5.83 (*) 4.22 - 5.81 (MIL/uL)    Hemoglobin 18.5 (*) 13.0 - 17.0 (g/dL)    HCT 16.1 (*) 09.6 - 52.0 (%)    MCV 89.4  78.0 - 100.0 (fL)    MCH 31.7  26.0 - 34.0 (pg)    MCHC 35.5  30.0 - 36.0 (g/dL)    RDW 04.5  40.9 - 81.1 (%)    Platelets 205  150 - 400 (K/uL)   DIFFERENTIAL     Status: Abnormal   Collection Time   07/08/11  9:05 PM      Component Value Range Comment   Neutrophils Relative 64  43 - 77 (%)    Neutro Abs 9.5 (*) 1.7 - 7.7 (K/uL)    Lymphocytes Relative 25  12 - 46 (%)    Lymphs Abs 3.7  0.7 - 4.0 (K/uL)    Monocytes Relative 8  3 - 12 (%)    Monocytes Absolute 1.2 (*) 0.1 - 1.0 (K/uL)    Eosinophils Relative 3  0 - 5 (%)     Eosinophils Absolute 0.4  0.0 - 0.7 (K/uL)    Basophils Relative 0  0 - 1 (%)    Basophils Absolute 0.1  0.0 - 0.1 (K/uL)   POCT I-STAT, CHEM 8     Status: Abnormal   Collection Time   07/08/11  9:24 PM      Component Value Range Comment   Sodium 140  135 - 145 (mEq/L)    Potassium 3.8  3.5 - 5.1 (mEq/L)    Chloride 103  96 - 112 (mEq/L)    BUN 9  6 - 23 (mg/dL)    Creatinine, Ser 9.14  0.50 - 1.35 (mg/dL)    Glucose, Bld 90  70 - 99 (mg/dL)    Calcium, Ion 7.82  1.12 - 1.32 (mmol/L)    TCO2 25  0 - 100 (mmol/L)    Hemoglobin 18.4 (*) 13.0 - 17.0 (g/dL)    HCT 95.6 (*) 21.3 - 52.0 (%)  Ct Abdomen Pelvis W Contrast  07/09/2011  *RADIOLOGY REPORT*  Clinical Data: Right lower quadrant pain. Elevated white blood count.  CT ABDOMEN AND PELVIS WITH CONTRAST  Technique:  Multidetector CT imaging of the abdomen and pelvis was performed following the standard protocol during bolus administration of intravenous contrast.  Contrast:  100 ml Omnipaque-300  Comparison: None.  Findings: Lung bases are clear.  No evidence for free air. Decreased attenuation of the liver suggests hepatic steatosis.  The portal venous system is patent.  Normal appearance of the kidneys, adrenal glands and spleen.  No significant free fluid or lymphadenopathy in the abdomen or pelvis.  No gross abnormality to the prostate or seminal vesicles.  Urinary bladder is mildly distended.  The appendix is mildly distended measuring up to 9 mm.  The appendix is most distended near the tip.  There is mild stranding around the appendix, best seen on the sagittal images.  The distal appendix appears to be fluid filled.  No acute bony abnormality.  IMPRESSION:  The appendix is mildly distended and there are subtle inflammatory changes around the appendix.  Findings are concerning for an early appendicitis.  Critical Value/emergent results were called by telephone at the time of interpretation on 07/09/2011  at 12:15 a.m.  to  Earley Favor,  who verbally acknowledged these results.  Original Report Authenticated By: Richarda Overlie, M.D.    Review of Systems  Constitutional: Negative.   HENT: Negative.   Eyes: Negative.   Respiratory: Negative.   Cardiovascular: Negative.   Gastrointestinal: Positive for abdominal pain. Negative for heartburn, nausea, vomiting, diarrhea and constipation.  Genitourinary: Negative.   Musculoskeletal: Negative.   Skin: Negative.   Neurological: Negative.   Endo/Heme/Allergies: Negative.   Psychiatric/Behavioral: Negative.     Blood pressure 125/78, pulse 80, temperature 97.4 F (36.3 C), temperature source Oral, resp. rate 16, height 5\' 9"  (1.753 m), weight 203 lb (92.08 kg), SpO2 94.00%. Physical Exam  Constitutional: He is oriented to person, place, and time. He appears well-developed and well-nourished. No distress.  HENT:  Head: Normocephalic and atraumatic.  Mouth/Throat: No oropharyngeal exudate.  Eyes: Conjunctivae are normal. Pupils are equal, round, and reactive to light. No scleral icterus.  Neck: Normal range of motion. Neck supple. No thyromegaly present.  Cardiovascular: Normal rate, regular rhythm, normal heart sounds and intact distal pulses.  Exam reveals no gallop and no friction rub.   No murmur heard. Respiratory: Effort normal and breath sounds normal. No respiratory distress. He has no wheezes. He has no rales. He exhibits no tenderness.  GI: Soft. He exhibits distension (mildly distended). He exhibits no mass. There is tenderness (RLQ tenderness). There is no rebound and no guarding.  Musculoskeletal: Normal range of motion. He exhibits no edema and no tenderness.  Lymphadenopathy:    He has no cervical adenopathy.  Neurological: He is alert and oriented to person, place, and time. Coordination normal.  Skin: Skin is warm and dry. No rash noted. He is not diaphoretic. No erythema. No pallor.  Psychiatric: He has a normal mood and affect. His behavior is normal. Judgment  and thought content normal.     Assessment/Plan Acute appendicitis.  NPO   IVF  Invanz  To OR for lap appy  Appendectomy was described to the patient.  The incisions and surgical technique were explained.  The patient was advised that some of the hair on the abdomen would be clipped, and that a foley catheter would be placed.  I advised the patient  of the risks of surgery including, but not limited to, bleeding, infection, damage to other structures, risk of an open operation, risk of abscess, and risk of blood clot.  The recovery was also described to the patient.  He was advised that he will have lifting restrictions for 2 weeks.     Rabon Scholle 07/09/2011, 1:18 AM

## 2011-07-09 NOTE — Progress Notes (Signed)
[facility] ([facility]) - ATTENDING: Discussed with Dr. Donell Beers.  As above. Velora Heckler, MD, [facility] Office: [phone]

## 2011-07-09 NOTE — Discharge Summary (Signed)
Physician Discharge Summary  Patient ID: JAHMANI STAUP MRN: [id number] DOB/AGE: [birth year] 41 y.o.  Admit date: 07/08/2011 Discharge date: 07/09/2011  Admission Diagnoses: Acute appendicitis without mention of peritonitis     Discharge Diagnoses: Same   PROCEDURES: Acute appendicitis without mention of peritonitis   Hospital Course: Pt is 41 year old male who awoke with right sided abdominal pain. He thought it was gas, but it gradually worsened over the course of the day. He denies anorexia, nausea, vomiting, diarrhea. He denies fever/ chills. He has not had pain like this before. It hurts worse when he moves.  He underwent surgery the evening of admission and did well.  He was discharged the following afternoon.  Disposition: home  Condition on D/C: Improved   Medication List  As of 07/09/2011 12:31 PM   TAKE these medications         acetaminophen 325 MG tablet   Commonly known as: TYLENOL   Take 2 tablets (650 mg total) by mouth every 4 (four) hours as needed.      clobetasol cream 0.05 %   Commonly known as: TEMOVATE   Apply 1 application topically 2 (two) times daily.      HYDROcodone-acetaminophen 5-325 MG per tablet   Commonly known as: NORCO   Take 1-2 tablets by mouth every 4 (four) hours as needed.      loratadine 10 MG tablet   Commonly known as: CLARITIN   Take 10 mg by mouth daily as needed. For seasonal allergies      omeprazole 20 MG capsule   Commonly known as: PRILOSEC   Take 20 mg by mouth daily before breakfast.      testosterone 50 MG/5GM Gel   Commonly known as: ANDROGEL   Place 5 g onto the skin daily. Applies to shoulders           Follow-up Information    Follow up with Josue Hector, MD. Schedule an appointment as soon as possible for a visit in 2 weeks. (As needed)       Follow up with BYERLY,FAERA, MD. Schedule an appointment as soon as possible for a visit in 2 weeks. (Call and ask for DR.Byerly  or the [facility])    [employer] information:   [employer], [place] [address] [place] [address] [phone]          Signed: Sherrie George 07/09/2011, 12:31 PM

## 2011-07-09 NOTE — Preoperative (Signed)
Beta Blockers   Reason not to administer Beta Blockers:Not Applicable 

## 2011-07-09 NOTE — Anesthesia Postprocedure Evaluation (Signed)
  Anesthesia Post-op Note  Patient: Jeffery Thompson  Procedure(s) Performed: Procedure(s) (LRB): APPENDECTOMY LAPAROSCOPIC ()  Patient Location: PACU  Anesthesia Type: General  Level of Consciousness: awake and sedated  Airway and Oxygen Therapy: Patient Spontanous Breathing  Post-op Pain: mild  Post-op Assessment: Post-op Vital signs reviewed  Post-op Vital Signs: Reviewed and stable  Complications: No apparent anesthesia complications

## 2011-07-09 NOTE — Anesthesia Procedure Notes (Signed)
Procedure Name: Intubation Date/Time: 07/09/2011 2:03 AM Performed by: Delbert Harness Pre-anesthesia Checklist: Patient identified, Timeout performed, Emergency Drugs available, Suction available and Patient being monitored Patient Re-evaluated:Patient Re-evaluated prior to inductionOxygen Delivery Method: Circle system utilized Preoxygenation: Pre-oxygenation with 100% oxygen Intubation Type: IV induction and Cricoid Pressure applied Ventilation: Mask ventilation without difficulty and Oral airway inserted - appropriate to patient size Laryngoscope Size: Mac and 4 Grade View: Grade II Tube type: Oral Tube size: 7.5 mm Number of attempts: 1 Airway Equipment and Method: Stylet Placement Confirmation: ETT inserted through vocal cords under direct vision,  breath sounds checked- equal and bilateral and positive ETCO2 Secured at: 22 cm Tube secured with: Tape Dental Injury: Teeth and Oropharynx as per pre-operative assessment

## 2011-07-09 NOTE — Anesthesia Preprocedure Evaluation (Addendum)
Anesthesia Evaluation  Patient identified by MRN, date of birth, ID band Patient awake    Reviewed: Allergy & Precautions, H&P , NPO status , Patient's Chart, lab work & pertinent test results  Airway Mallampati: II TM Distance: >3 FB Neck ROM: full    Dental  (+) Dental Advisory Given   Pulmonary asthma ,  Asthma as a child clear to auscultation  Pulmonary exam normal       Cardiovascular neg cardio ROS Regular Normal    Neuro/Psych Negative Neurological ROS  Negative Psych ROS   GI/Hepatic Neg liver ROS, hiatal hernia, Medicated and Controlled,Patient received Oral Contrast Agents,  Endo/Other  Negative Endocrine ROS  Renal/GU negative Renal ROS     Musculoskeletal   Abdominal Normal abdominal exam  (+)   Peds  Hematology negative hematology ROS (+)   Anesthesia Other Findings   Reproductive/Obstetrics                          Anesthesia Physical Anesthesia Plan  ASA: II and Emergent  Anesthesia Plan: General ETT and Rapid Sequence   Post-op Pain Management:    Induction: Intravenous  Airway Management Planned: Oral ETT  Additional Equipment:   Intra-op Plan:   Post-operative Plan: Extubation in OR  Informed Consent: I have reviewed the patients History and Physical, chart, labs and discussed the procedure including the risks, benefits and alternatives for the proposed anesthesia with the patient or authorized representative who has indicated his/her understanding and acceptance.   Dental advisory given  Plan Discussed with: Anesthesiologist, Surgeon and CRNA  Anesthesia Plan Comments:        Anesthesia Quick Evaluation

## 2011-07-09 NOTE — Op Note (Signed)
Appendectomy, Lap, Procedure Note  Indications: [name] presented with a history of right-sided abdominal pain. A CT revealed findings consistent with acute appendicitis.  Pre-operative Diagnosis: Acute appendicitis without mention of peritonitis  Post-operative Diagnosis: Acute appendicitis without mention of peritonitis  Surgeon: Almond Lint   Assistants: none  Anesthesia: General endotracheal anesthesia and Local anesthesia 1% buffered lidocaine, 0.25.% bupivacaine  ASA Class: 2  Procedure Details  [name] was seen again in the Holding Room. The risks, benefits, complications, treatment options, and expected outcomes were discussed with [name] and/or family. The possibilities of perforation of viscus, bleeding, recurrent infection, the need for additional procedures, failure to diagnose a condition, and creating a complication requiring transfusion or operation were discussed. There was concurrence with the proposed plan and informed consent was obtained. The site of surgery was properly noted. [name] was taken to Operating Room, identified as Jeffery Thompson and the procedure verified as Appendectomy. A Time Out was held and the above information confirmed.  [name] was placed in the supine position and general anesthesia was induced, along with placement of orogastric tube, Venodyne boots, and a Foley catheter. The abdomen was prepped and draped in a sterile fashion. A one centimeter infraumbilical incision was made.  The umbilical stalk was elevated, and the midline fascia was incised with a #11 blade.  A Kelly clamp was used to confirm entrance into the peritoneal cavity.  A pursestring suture was passed around the incision with a 0 Vicryl.  The Hasson was introduced into the abdomen and the tails of the suture were used to hold the Hasson in place.   The pneumoperitoneum was then established to steady pressure of 15 mmHg.  Additional 5 mm cannulas then placed in  the left lower quadrant of the abdomen and the suprapubic region under direct visualization. A careful evaluation of the entire abdomen was carried out. [name] was placed in Trendelenburg and left lateral decubitus position. The small intestines were retracted in the cephalad and left lateral direction away from the pelvis and right lower quadrant. [name] was found to have an enlarged and inflamed appendix that was extending into the pelvis. There was no evidence of perforation.  The appendix was carefully dissected. The appendix was was skeletonized with the harmonic scalpel.   The appendix was divided at its base using an endo-GIA stapler. Minimal appendiceal stump was left in place. The appendix was removed from the abdomen with an Endocatch bag through the umbilical port.  There was no evidence of bleeding, leakage, or complication after division of the appendix. Irrigation was also performed and irrigate suctioned from the abdomen as well.  The umbilical port site was closed with the purse string suture. There was no residual palpable fascial defect.  The trocar site skin wounds were closed with 4-0 Monocryl.  Instrument, sponge, and needle counts were correct at the conclusion of the case.   Findings: The appendix was found to be inflamed. There were not signs of necrosis.  There was not perforation. There was not abscess formation.  Estimated Blood Loss:  Minimal         Specimens: Appendix to pathology         Complications:  None; Jeffery Thompson tolerated the procedure well.         Disposition: PACU - hemodynamically stable.         Condition: stable

## 2011-07-09 NOTE — Progress Notes (Signed)
UR of chart complete.  

## 2011-07-09 NOTE — Progress Notes (Signed)
D/C papers given to pt and pt discharged to home.

## 2011-07-09 NOTE — Transfer of Care (Signed)
Immediate Anesthesia Transfer of Care Note  Patient: Jeffery Thompson  Procedure(s) Performed: Procedure(s) (LRB): APPENDECTOMY LAPAROSCOPIC ()  Patient Location: PACU  Anesthesia Type: General  Level of Consciousness: awake, alert  and oriented  Airway & Oxygen Therapy: Patient Spontanous Breathing and Patient connected to nasal cannula oxygen  Post-op Assessment: Report given to PACU RN and Post -op Vital signs reviewed and stable  Post vital signs: Reviewed and stable  Complications: No apparent anesthesia complications

## 2011-07-09 NOTE — ED Provider Notes (Addendum)
Medical screening examination/treatment/procedure(s) were conducted as a shared visit with non-physician practitioner(s) and myself.  I personally evaluated the patient during the encounter  Patient presents with one day of abdominal pain. On exam he does have tenderness to palpation in the right lower quadrant. He otherwise appears comfortable at this time. CT scan does suggest acute appendicitis and he has a leukocytosis. I have consulted  Dr. Donell Beers who is on call for general surgery. Celene Kras, MD 07/09/11 Marcie Bal  Celene Kras, MD 07/09/11 [phone]

## 2011-07-09 NOTE — Discharge Instructions (Signed)
Laparoscopic Appendectomy Appendectomy is surgery to remove the appendix. Laparoscopic surgery uses several small cuts (incisions) instead of one large incision. Laparoscopic surgery offers a shorter recovery time and less discomfort. LET YOUR CAREGIVER KNOW ABOUT:  Allergies to food or medicine.   Medicines taken, including vitamins, dietary supplements, herbs, eyedrops, over-the-counter medicines, and creams.   Use of steroids (by mouth or creams).   Previous problems with anesthetics or numbing medicines.   History of bleeding problems or blood clots.   Previous surgery.   Other health problems, including diabetes, heart problems, lung problems, and kidney problems.   Possibility of pregnancy, if this applies.  RISKS AND COMPLICATIONS  Infection. A germ starts growing in the wound. This can usually be treated with antibiotics. In some cases, the wound will need to be opened and cleaned.   Bleeding.   Damage to other organs.   Sores (abscesses).   Chronic pain at the incision sites. This is defined as pain that lasts for more than 3 months.   Blood clots in the legs that may rarely travel to the lungs.   Infection in the lungs (pneumonia).  BEFORE THE PROCEDURE Appendectomy is usually performed immediately after an inflamed appendix (appendicitis) is diagnosed. No preparation is necessary ahead of this procedure. PROCEDURE  You will be given medicine that makes you sleep (general anesthetic). After you are asleep, a flexible tube (catheter) may be inserted into your bladder to drain your urine during surgery. The tube is removed before you wake up after surgery. When you are asleep, carbondioxide gas will be used to inflate your abdomen. This will allow your surgeon to see inside your abdomen and perform your surgery. Three small incisions will be made in your abdomen. Your surgeon will insert a thin, lighted tube (laparoscope) through one of the incisions. Your surgeon will  look through the laparoscope while performing the surgery. Other tools will be inserted through the other incisions. Laparoscopic procedures may not be appropriate when:  There is major scarring from a previous surgery.   The patient has bleeding disorders.   A pregnancy is near term.   There are other conditions which make the laparoscopic procedure impossible, such as an advanced infection or a ruptured appendix.  If your surgeon feels it is not safe to continue with the laparoscopic procedure, he or she will perform an open surgery instead. This gives the surgeon a larger view and more space to work. Open surgery requires a longer recovery time. After your appendix is removed, your incisions will be closed with stitches (sutures) or skin adhesive. AFTER THE PROCEDURE You will be taken to a recovery room. When the anesthesia has worn off, you will be returned to your hospital room. You will be given pain medicines to keep you comfortable. Ask your caregiver how long your hospital stay will be. Document Released: 12/11/2003 Document Revised: 01/08/2011 Document Reviewed: 11/05/2010 ExitCare Patient Information 2012 ExitCare, [facility] ______[facility] LAPAROSCOPIC SURGERY: POST OP INSTRUCTIONS Always review your discharge instruction sheet given to you by the facility where your surgery was performed. IF YOU HAVE DISABILITY OR FAMILY LEAVE FORMS, YOU MUST BRING THEM TO THE OFFICE FOR PROCESSING.   DO NOT GIVE THEM TO YOUR DOCTOR.  1. A prescription for pain medication may be given to you upon discharge.  Take your pain medication as prescribed, if needed.  If narcotic pain medicine is not needed, then you may take acetaminophen (Tylenol) or ibuprofen (Advil) as needed. 2. Take your  usually prescribed medications unless otherwise directed. 3. If you need a refill on your pain medication, please contact your pharmacy.  They will contact our office to request authorization.  Prescriptions will not be filled after 5pm or on week-ends. 4. You should follow a light diet the first few days after arrival home, such as soup and crackers, etc.  Be sure to include lots of fluids daily. 5. Most patients will experience some swelling and bruising in the area of the incisions.  Ice packs will help.  Swelling and bruising can take several days to resolve.  6. It is common to experience some constipation if taking pain medication after surgery.  Increasing fluid intake and taking a stool softener (such as Colace) will usually help or prevent this problem from occurring.  A mild laxative (Milk of Magnesia or Miralax) should be taken according to package instructions if there are no bowel movements after 48 hours. 7. Unless discharge instructions indicate otherwise, you may remove your bandages 24-48 hours after surgery, and you may shower at that time.  You may have steri-strips (small skin tapes) in place directly over the incision.  These strips should be left on the skin for 7-10 days.  If your surgeon used skin glue on the incision, you may shower in 24 hours.  The glue will flake off over the next 2-3 weeks.  Any sutures or staples will be removed at the office during your follow-up visit. 8. ACTIVITIES:  You may resume regular (light) daily activities beginning the next day--such as daily self-care, walking, climbing stairs--gradually increasing activities as tolerated.  You may have sexual intercourse when it is comfortable.  Refrain from any heavy lifting or straining until approved by your doctor. a. You may drive when you are no longer taking prescription pain medication, you can comfortably wear a seatbelt, and you can safely maneuver your car and apply brakes. b. RETURN TO WORK:  __________________________________________________________ 9. You should see your doctor in the office for a follow-up appointment approximately 2-3 weeks after your surgery.  Make sure that you call for  this appointment within a day or two after you arrive home to insure a convenient appointment time. 10. OTHER INSTRUCTIONS: __________________________________________________________________________________________________________________________ __________________________________________________________________________________________________________________________ WHEN TO CALL YOUR DOCTOR: 1. Fever over 101.0 2. Inability to urinate 3. Continued bleeding from incision. 4. Increased pain, redness, or drainage from the incision. 5. Increasing abdominal pain  The clinic staff is available to answer your questions during regular business hours.  Please don't hesitate to call and ask to speak to one of the nurses for clinical concerns.  If you have a medical emergency, go to the nearest emergency room or call 911.  A surgeon from [facility] is always on call at the hospital. [address], [address], [place], [place]  [address] ? [address] [address], [place], [place]   [address] [phone] ? [phone] ? FAX [phone] Web site: [email or web address] C.

## 2011-07-09 NOTE — ED Notes (Signed)
Dr. Byerly at bedside.  

## 2011-07-09 NOTE — Discharge Summary (Signed)
[facility] ([facility]) - ATTENDING: As above.  Follow up with Dr. Donell Beers at [facility] office. Velora Heckler, MD, [facility] Office: [phone]

## 2011-07-10 ENCOUNTER — Encounter (HOSPITAL_COMMUNITY): Payer: Self-pay | Admitting: General Surgery

## 2011-07-22 ENCOUNTER — Ambulatory Visit (INDEPENDENT_AMBULATORY_CARE_PROVIDER_SITE_OTHER): Payer: BC Managed Care – PPO | Admitting: General Surgery

## 2011-07-22 ENCOUNTER — Encounter (INDEPENDENT_AMBULATORY_CARE_PROVIDER_SITE_OTHER): Payer: Self-pay | Admitting: General Surgery

## 2011-07-22 VITALS — BP 126/78 | HR 68 | Temp 97.9°F | Resp 18 | Ht 69.0 in | Wt 196.6 lb

## 2011-07-22 DIAGNOSIS — K3580 Unspecified acute appendicitis: Secondary | ICD-10-CM

## 2011-07-22 DIAGNOSIS — Z9889 Other specified postprocedural states: Secondary | ICD-10-CM

## 2011-07-22 NOTE — Patient Instructions (Signed)
Follow up as needed

## 2011-07-22 NOTE — Progress Notes (Signed)
Jeffery Thompson 1971/01/18 [id number] 07/22/2011   History of Present Illness: Jeffery Thompson is a  41 y.o. male who presents today status post lap appy by Dr. Donell Beers.  Pathology reveals acute appendicitis.  The patient is tolerating a regular diet, having normal bowel movements, has good pain control.  He  is back to most normal activities.   Physical Exam: Abd: soft, nontender, active bowel sounds, nondistended.  All incisions are well healed.  Impression: 1.  Acute appendicitis, s/p lap appy  Plan: He  is able to return to normal activities. He  may follow up on a prn basis.

## 2012-06-15 ENCOUNTER — Telehealth (INDEPENDENT_AMBULATORY_CARE_PROVIDER_SITE_OTHER): Payer: Self-pay

## 2012-06-15 ENCOUNTER — Encounter (INDEPENDENT_AMBULATORY_CARE_PROVIDER_SITE_OTHER): Payer: Self-pay | Admitting: General Surgery

## 2012-06-15 ENCOUNTER — Ambulatory Visit (INDEPENDENT_AMBULATORY_CARE_PROVIDER_SITE_OTHER): Payer: BC Managed Care – PPO | Admitting: General Surgery

## 2012-06-15 VITALS — BP 130/80 | HR 80 | Temp 97.5°F | Resp 14 | Ht 69.0 in | Wt 196.0 lb

## 2012-06-15 DIAGNOSIS — L02214 Cutaneous abscess of groin: Secondary | ICD-10-CM

## 2012-06-15 DIAGNOSIS — L02219 Cutaneous abscess of trunk, unspecified: Secondary | ICD-10-CM

## 2012-06-15 NOTE — Telephone Encounter (Signed)
Dr. Lysbeth Galas office calling for appointment in pour Urgent Office today.  Patient has infected abscess located inner thigh (groin region) appointment made for 06/15/12 @ 2:30 pm w/Dr. Dwain Sarna.  Dr. Joyce Copa office advised patient need's to arrive 15-20 minutes prior to appointment for registration.

## 2012-06-15 NOTE — Progress Notes (Signed)
Subjective:     Patient ID: Jeffery Thompson, male   DOB: 06/20/1970, 42 y.o.   MRN: [id number]  HPI 25 yom with a 6 months history of a right groin mass that has caused some discomfort. Has never been infected. It has been injected previously by Dr. Margo Aye and treated with abx.  Recently he has increased in size and tenderness without any drainage.  He denies fevers.  It is difficult for him to walk right now also.  He was seen by Dr. Margo Aye earlier today and referred for evaluation.  He smokes .5 ppd.  He has no real other medical problems.  Review of Systems     Objective:   Physical Exam 4x3 cm fluctuant tender abscess with some mild surrounding erythema    Assessment:     Right groin abscess    Plan:     We discussed incision and drainage with risks.  I anesthetized and then made cruciate incision.  He had large volume of foul smelling pus come out.  This was then packed with iodoform.  Dressing placed.  Will leave dressing in place and gauze.  Return Friday and I will remove.  He will fill abx given to him.  We discussed that this may recur if was cyst and then we would need to excise electively.

## 2012-06-18 ENCOUNTER — Encounter (INDEPENDENT_AMBULATORY_CARE_PROVIDER_SITE_OTHER): Payer: Self-pay | Admitting: General Surgery

## 2012-06-18 ENCOUNTER — Ambulatory Visit (INDEPENDENT_AMBULATORY_CARE_PROVIDER_SITE_OTHER): Payer: BC Managed Care – PPO | Admitting: General Surgery

## 2012-06-18 VITALS — BP 118/74 | HR 80 | Resp 16 | Ht 69.0 in | Wt 193.0 lb

## 2012-06-18 DIAGNOSIS — Z09 Encounter for follow-up examination after completed treatment for conditions other than malignant neoplasm: Secondary | ICD-10-CM

## 2012-06-18 NOTE — Progress Notes (Signed)
Subjective:     Patient ID: Jeffery Thompson, male   DOB: 1971-01-23, 43 y.o.   MRN: [id number]  HPI This is a 42 year old male who I saw earlier this week with a right groin abscess. I incised and drained this in the office. It was packed. He has done well since then he returns today with some drainage but is otherwise much better. He has no fevers. He is on antibiotics right now.  Review of Systems     Objective:   Physical Exam I removed his dressing and is packing. There is no evidence of infection. There is some clear drainage coming out of the incision.    Assessment:     Status post right groin Abscess incision and drainage    Plan:     I packed this again today. I told him he could remove his packing on Monday. I think that that this will heal. I told him of the packing falls out over the weekend that he does not need to repack this or call. I told him once the packing falls out he should get in a tube daily and keep this clean. It should then heal on its own. I asked him to call me back if there is any trouble with healing, any questions, or a mass develops at this site as I told them that he may need a cyst excised over the long term to prevent this from again.

## 2012-06-18 NOTE — Patient Instructions (Signed)
Remove packing if doesn't fall out on own Monday.  Then get in tub daily for a week to soak.  Call me back and return if you have any questions or you feel a lump at this site once it heals.

## 2012-08-20 ENCOUNTER — Encounter (INDEPENDENT_AMBULATORY_CARE_PROVIDER_SITE_OTHER): Payer: Self-pay | Admitting: General Surgery

## 2012-08-20 ENCOUNTER — Ambulatory Visit (INDEPENDENT_AMBULATORY_CARE_PROVIDER_SITE_OTHER): Payer: BC Managed Care – PPO | Admitting: General Surgery

## 2012-08-20 VITALS — BP 122/78 | HR 82 | Resp 18 | Ht 69.0 in | Wt 189.0 lb

## 2012-08-20 DIAGNOSIS — T8189XA Other complications of procedures, not elsewhere classified, initial encounter: Secondary | ICD-10-CM

## 2012-08-20 DIAGNOSIS — IMO0002 Reserved for concepts with insufficient information to code with codable children: Secondary | ICD-10-CM

## 2012-08-20 NOTE — Patient Instructions (Signed)
Apply antibiotic ointment daily and keep covered for around a week.  Shower daily.  Follow up as needed.

## 2012-08-23 NOTE — Progress Notes (Signed)
Pt had question of wound issue that resolved.  Had small red spot in umbilical incision (what sounds like stitch granuloma) that drained on its own and resolved prior to visit.  Doing well otherwise.

## 2015-07-29 ENCOUNTER — Encounter (HOSPITAL_COMMUNITY): Payer: Self-pay | Admitting: Emergency Medicine

## 2015-07-29 ENCOUNTER — Emergency Department (HOSPITAL_COMMUNITY): Payer: 59

## 2015-07-29 ENCOUNTER — Emergency Department (HOSPITAL_COMMUNITY)
Admission: EM | Admit: 2015-07-29 | Discharge: 2015-07-29 | Disposition: A | Discharge status: Home / Self Care | Payer: 59 | Attending: Emergency Medicine | Admitting: Emergency Medicine

## 2015-07-29 DIAGNOSIS — Z793 Long term (current) use of hormonal contraceptives: Secondary | ICD-10-CM | POA: Insufficient documentation

## 2015-07-29 DIAGNOSIS — M940 Chondrocostal junction syndrome [Tietze]: Secondary | ICD-10-CM | POA: Diagnosis not present

## 2015-07-29 DIAGNOSIS — K219 Gastro-esophageal reflux disease without esophagitis: Secondary | ICD-10-CM | POA: Insufficient documentation

## 2015-07-29 DIAGNOSIS — Z79899 Other long term (current) drug therapy: Secondary | ICD-10-CM | POA: Insufficient documentation

## 2015-07-29 DIAGNOSIS — F17210 Nicotine dependence, cigarettes, uncomplicated: Secondary | ICD-10-CM | POA: Diagnosis not present

## 2015-07-29 DIAGNOSIS — Z8639 Personal history of other endocrine, nutritional and metabolic disease: Secondary | ICD-10-CM | POA: Diagnosis not present

## 2015-07-29 DIAGNOSIS — Z7952 Long term (current) use of systemic steroids: Secondary | ICD-10-CM | POA: Diagnosis not present

## 2015-07-29 DIAGNOSIS — R0781 Pleurodynia: Secondary | ICD-10-CM | POA: Diagnosis present

## 2015-07-29 DIAGNOSIS — R05 Cough: Secondary | ICD-10-CM | POA: Diagnosis not present

## 2015-07-29 MED ORDER — OXYCODONE-ACETAMINOPHEN 5-325 MG PO TABS: 1.0000 | ORAL_TABLET | Freq: Four times a day (QID) | ORAL | Status: DC | PRN

## 2015-07-29 MED ORDER — OXYCODONE-ACETAMINOPHEN 5-325 MG PO TABS
2.0000 | ORAL_TABLET | Freq: Once | ORAL | Status: AC
  Administered 2015-07-29: 2 via ORAL
  Filled 2015-07-29: qty 2

## 2015-07-29 MED ORDER — KETOROLAC TROMETHAMINE 60 MG/2ML IM SOLN
60.0000 mg | Freq: Once | INTRAMUSCULAR | Status: AC
  Administered 2015-07-29: 60 mg via INTRAMUSCULAR
  Filled 2015-07-29: qty 2

## 2015-07-29 MED ORDER — NAPROXEN 500 MG PO TABS: 500.0000 mg | ORAL_TABLET | Freq: Two times a day (BID) | ORAL | Status: DC

## 2015-07-29 NOTE — ED Notes (Signed)
Pt was watching TV, coughed and began having intense rib pain on the right side.

## 2015-07-29 NOTE — Discharge Instructions (Signed)
Costochondritis Costochondritis, sometimes called Tietze syndrome, is a swelling and irritation (inflammation) of the tissue (cartilage) that connects your ribs with your breastbone (sternum). It causes pain in the chest and rib area. Costochondritis usually goes away on its own over time. It can take up to 6 weeks or longer to get better, especially if you are unable to limit your activities. CAUSES  Some cases of costochondritis have no known cause. Possible causes include:  Injury (trauma).  Exercise or activity such as lifting.  Severe coughing. SIGNS AND SYMPTOMS  Pain and tenderness in the chest and rib area.  Pain that gets worse when coughing or taking deep breaths.  Pain that gets worse with specific movements. DIAGNOSIS  Your health care provider will do a physical exam and ask about your symptoms. Chest X-rays or other tests may be done to rule out other problems. TREATMENT  Costochondritis usually goes away on its own over time. Your health care provider may prescribe medicine to help relieve pain. HOME CARE INSTRUCTIONS   Avoid exhausting physical activity. Try not to strain your ribs during normal activity. This would include any activities using chest, abdominal, and side muscles, especially if heavy weights are used.  Apply ice to the affected area for the first 2 days after the pain begins.  Put ice in a plastic bag.  Place a towel between your skin and the bag.  Leave the ice on for 20 minutes, 2-3 times a day.  Only take over-the-counter or prescription medicines as directed by your health care provider. SEEK MEDICAL CARE IF:  You have redness or swelling at the rib joints. These are signs of infection.  Your pain does not go away despite rest or medicine. SEEK IMMEDIATE MEDICAL CARE IF:   Your pain increases or you are very uncomfortable.  You have shortness of breath or difficulty breathing.  You cough up blood.  You have worse chest pains,  sweating, or vomiting.  You have a fever or persistent symptoms for more than 2-3 days.  You have a fever and your symptoms suddenly get worse. MAKE SURE YOU:   Understand these instructions.  Will watch your condition.  Will get help right away if you are not doing well or get worse.   This information is not intended to replace advice given to you by your health care provider. Make sure you discuss any questions you have with your health care provider.   Document Released: 02/05/2005 Document Revised: 02/16/2013 Document Reviewed: 11/30/2012 Elsevier Interactive Patient Education 2016 Elsevier Inc. Cryotherapy Cryotherapy is when you put ice on your injury. Ice helps lessen pain and puffiness (swelling) after an injury. Ice works the best when you start using it in the first 24 to 48 hours after an injury. HOME CARE  Put a dry or damp towel between the ice pack and your skin.  You may press gently on the ice pack.  Leave the ice on for no more than 10 to 20 minutes at a time.  Check your skin after 5 minutes to make sure your skin is okay.  Rest at least 20 minutes between ice pack uses.  Stop using ice when your skin loses feeling (numbness).  Do not use ice on someone who cannot tell you when it hurts. This includes small children and people with memory problems (dementia). GET HELP RIGHT AWAY IF:  You have white spots on your skin.  Your skin turns blue or pale.  Your skin feels waxy or  hard.  Your puffiness gets worse. MAKE SURE YOU:   Understand these instructions.  Will watch your condition.  Will get help right away if you are not doing well or get worse.   This information is not intended to replace advice given to you by your health care provider. Make sure you discuss any questions you have with your health care provider.   Document Released: 10/15/2007 Document Revised: 07/21/2011 Document Reviewed: 12/19/2010 Elsevier Interactive Patient Education  [employer] Elsevier Inc[employer]

## 2015-07-29 NOTE — ED Provider Notes (Signed)
CSN: [id number]     Arrival date & time 07/29/15  2204 History  By signing my name below, I, Evon Slackerrance Branch, attest that this documentation has been prepared under the direction and in the presence of TRW AutomotiveKelly Bao Coreas, PA-C. Electronically Signed: Evon Slackerrance Branch, ED Scribe. 07/29/2015. 10:47 PM.     Chief Complaint  Patient presents with  . Rib Injury    The history is provided by the patient. No language interpreter was used.   HPI Comments: Jeffery Thompson is a 45 y.o. male who presents to the Emergency Department complaining of sudden sharp-stabbing right sided rib pani. Pt states that he was sitting watching TV coughed and had the sudden onset of pain. Pt reports that he has had a cough for about 2 days. He states that his pani is worse with deep breathing or coughing. Pt denies SOB or other related symptoms. Pt reports Hx of hiatal hernia and GERD.   Past Medical History  Diagnosis Date  . Low testosterone   . H/O hiatal hernia   . GERD (gastroesophageal reflux disease)    Past Surgical History  Procedure Laterality Date  . Laparoscopic appendectomy  07/09/2011    Procedure: APPENDECTOMY LAPAROSCOPIC;  Surgeon: Almond LintFaera Byerly, MD;  Location: [facility] OR;  Service: General;;  . Cholecystectomy  2011 or 2012  . Foot surgery  2010 or 2011    Burr removed from big toe on right foot   Family History  Problem Relation Age of Onset  . Cancer Father     pancreatic  . Pulmonary fibrosis Mother    Social History  Substance Use Topics  . Smoking status: Current Every Day Smoker -- 0.50 packs/day for 1 years    Types: Cigarettes  . Smokeless tobacco: Never Used  . Alcohol Use: Yes     Comment: very rarely     Review of Systems  Respiratory: Positive for cough. Negative for shortness of breath.   Cardiovascular: Positive for chest pain (right sided rib pain).  All other systems reviewed and are negative.   Allergies  Tetracycline  Home Medications   Prior to Admission medications    Medication Sig Start Date End Date Taking? Authorizing Provider  clobetasol cream (TEMOVATE) 0.05 % Apply 1 application topically 2 (two) times daily.    Historical Provider, MD  colesevelam (WELCHOL) 625 MG tablet Take 1,875 mg by mouth 3 (three) times daily.    Historical Provider, MD  loratadine (CLARITIN) 10 MG tablet Take 10 mg by mouth daily as needed. For seasonal allergies    Historical Provider, MD  omeprazole (PRILOSEC) 20 MG capsule Take 20 mg by mouth daily before breakfast.     Historical Provider, MD  testosterone (ANDROGEL) 50 MG/5GM GEL Place 5 g onto the skin daily. Applies to shoulders    Historical Provider, MD   BP 140/95 mmHg  Pulse 71  Temp(Src) 98 F (36.7 C) (Oral)  Resp 16  SpO2 94%   Physical Exam  Constitutional: He is oriented to person, place, and time. He appears well-developed and well-nourished. No distress.  Nontoxic appearing  HENT:  Head: Normocephalic and atraumatic.  Eyes: Conjunctivae and EOM are normal. No scleral icterus.  Neck: Normal range of motion.  Cardiovascular: Normal rate, regular rhythm and intact distal pulses.   Pulmonary/Chest: Effort normal and breath sounds normal. No respiratory distress. He has no wheezes. He has no rales.    Good breath sounds in all lung fields. Chest expansion symmetric. TTP to R anterior chest  wall along the anterior axillary line. No crepitus noted. No deformity.  Musculoskeletal: Normal range of motion.  Neurological: He is alert and oriented to person, place, and time. He exhibits normal muscle tone. Coordination normal.  Ambulatory with steady gait.  Skin: Skin is warm and dry. No rash noted. He is not diaphoretic. No erythema. No pallor.  Psychiatric: He has a normal mood and affect. His behavior is normal.  Nursing note and vitals reviewed.   ED Course  Procedures (including critical care time) DIAGNOSTIC STUDIES: Oxygen Saturation is 94% on RA, adequate by my interpretation.    COORDINATION  OF CARE: 10:49 PM-Discussed treatment plan with pt at bedside and pt agreed to plan.   Labs Review Labs Reviewed - No data to display  Imaging Review Dg Ribs Unilateral W/chest Right  07/29/2015  CLINICAL DATA:  Right-sided rib pain after coughing today. EXAM: RIGHT RIBS AND CHEST - 3+ VIEW COMPARISON:  None. FINDINGS: Shallow inspiration. Normal heart size and pulmonary vascularity. No focal airspace disease or consolidation in the lungs. No blunting of costophrenic angles. No pneumothorax. Mediastinal contours appear intact. Right ribs appear intact. No acute displaced fractures or focal bone lesions are demonstrated. Surgical clips in the right upper quadrant. IMPRESSION: No evidence of active pulmonary disease. No acute displaced rib fractures. Electronically Signed   By: Burman Nieves M.D.   On: 07/29/2015 23:18      EKG Interpretation None      MDM   Final diagnoses:  Costochondritis    45 year old male presents to the emergency department for sudden onset of right rib pain after coughing forcefully. X-ray negative for rib fracture. No evidence of pneumothorax. Tenderness is reproducible on palpation. Suspect musculoskeletal etiology. Will manage with NSAIDs and Percocet. Patient discharged with an incentive spirometer. Return precautions discussed and provided. Patient discharged in satisfactory condition with no unaddressed concerns.  I personally performed the services described in this documentation, which was scribed in my presence. The recorded information has been reviewed and is accurate.        Antony Madura, PA-C 07/29/15 2334  Bethann Berkshire, MD 08/01/15 2005

## 2015-08-04 ENCOUNTER — Encounter (HOSPITAL_COMMUNITY): Payer: Self-pay | Admitting: Emergency Medicine

## 2015-08-04 ENCOUNTER — Emergency Department (HOSPITAL_COMMUNITY)
Admission: EM | Admit: 2015-08-04 | Discharge: 2015-08-05 | Disposition: A | Discharge status: Home / Self Care | Payer: 59 | Attending: Emergency Medicine | Admitting: Emergency Medicine

## 2015-08-04 DIAGNOSIS — Z791 Long term (current) use of non-steroidal anti-inflammatories (NSAID): Secondary | ICD-10-CM | POA: Diagnosis not present

## 2015-08-04 DIAGNOSIS — Z8639 Personal history of other endocrine, nutritional and metabolic disease: Secondary | ICD-10-CM | POA: Diagnosis not present

## 2015-08-04 DIAGNOSIS — Z7952 Long term (current) use of systemic steroids: Secondary | ICD-10-CM | POA: Diagnosis not present

## 2015-08-04 DIAGNOSIS — Y9389 Activity, other specified: Secondary | ICD-10-CM | POA: Diagnosis not present

## 2015-08-04 DIAGNOSIS — T7840XA Allergy, unspecified, initial encounter: Secondary | ICD-10-CM | POA: Diagnosis present

## 2015-08-04 DIAGNOSIS — Z79899 Other long term (current) drug therapy: Secondary | ICD-10-CM | POA: Diagnosis not present

## 2015-08-04 DIAGNOSIS — K219 Gastro-esophageal reflux disease without esophagitis: Secondary | ICD-10-CM | POA: Insufficient documentation

## 2015-08-04 DIAGNOSIS — L500 Allergic urticaria: Secondary | ICD-10-CM | POA: Diagnosis not present

## 2015-08-04 DIAGNOSIS — F17210 Nicotine dependence, cigarettes, uncomplicated: Secondary | ICD-10-CM | POA: Diagnosis not present

## 2015-08-04 DIAGNOSIS — Y998 Other external cause status: Secondary | ICD-10-CM | POA: Diagnosis not present

## 2015-08-04 DIAGNOSIS — T781XXA Other adverse food reactions, not elsewhere classified, initial encounter: Secondary | ICD-10-CM | POA: Insufficient documentation

## 2015-08-04 DIAGNOSIS — X58XXXA Exposure to other specified factors, initial encounter: Secondary | ICD-10-CM | POA: Insufficient documentation

## 2015-08-04 DIAGNOSIS — Y9289 Other specified places as the place of occurrence of the external cause: Secondary | ICD-10-CM | POA: Diagnosis not present

## 2015-08-04 NOTE — ED Notes (Signed)
Pt states he ate shrimp this evening and immediately got hives on his hands, head, neck, and on his back. Pt states that his eyes itched, but denied any airway constriction or mouth itching. Pt has clear lung sounds and has minimal hives on his torso. Pt states he took "baby benadryl" and that has helped his symptoms. Pt is calm and cooperative during assessment

## 2015-08-05 MED ORDER — PREDNISONE 20 MG PO TABS: ORAL_TABLET | ORAL | Status: DC

## 2015-08-05 MED ORDER — FAMOTIDINE 20 MG PO TABS: 20.0000 mg | ORAL_TABLET | Freq: Two times a day (BID) | ORAL | Status: DC

## 2015-08-05 MED ORDER — HYDROXYZINE HCL 25 MG PO TABS
25.0000 mg | ORAL_TABLET | Freq: Once | ORAL | Status: AC
  Administered 2015-08-05: 25 mg via ORAL
  Filled 2015-08-05: qty 1

## 2015-08-05 MED ORDER — DIPHENHYDRAMINE HCL 50 MG/ML IJ SOLN
50.0000 mg | Freq: Once | INTRAMUSCULAR | Status: AC
  Administered 2015-08-05: 50 mg via INTRAMUSCULAR
  Filled 2015-08-05: qty 1

## 2015-08-05 MED ORDER — FAMOTIDINE 20 MG PO TABS
20.0000 mg | ORAL_TABLET | Freq: Once | ORAL | Status: AC
  Administered 2015-08-05: 20 mg via ORAL
  Filled 2015-08-05: qty 1

## 2015-08-05 MED ORDER — HYDROXYZINE HCL 25 MG PO TABS: 50.0000 mg | ORAL_TABLET | Freq: Once | ORAL | Status: DC

## 2015-08-05 MED ORDER — METHYLPREDNISOLONE SODIUM SUCC 125 MG IJ SOLR
125.0000 mg | Freq: Once | INTRAMUSCULAR | Status: AC
  Administered 2015-08-05: 125 mg via INTRAMUSCULAR
  Filled 2015-08-05: qty 2

## 2015-08-05 NOTE — Discharge Instructions (Signed)
Stay cool, heat will make the rash and itching worse. You may continue to have rash or itching off and on for the next week. Takes the prednisone and Pepcid until gone. You can take Zyrtec once a day which is a antihistamine that does not cause drowsiness as much as Benadryl. However if you are having a lot of itching or the rash is getting worse she can take Benadryl 50 mg every 6 hours as needed. Return to the emergency department if you get swelling in your throat or feeling like having difficulty swallowing or breathing, have swelling of your tongue or lips. Try to avoid shrimp in the future.  Allergies An allergy is an abnormal reaction to a substance by the body's defense system (immune system). Allergies can develop at any age. WHAT CAUSES ALLERGIES? An allergic reaction happens when the immune system mistakenly reacts to a normally harmless substance, called an allergen, as if it were harmful. The immune system releases antibodies to fight the substance. Antibodies eventually release a chemical called histamine into the bloodstream. The release of histamine is meant to protect the body from infection, but it also causes discomfort. An allergic reaction can be triggered by:  Eating an allergen.  Inhaling an allergen.  Touching an allergen. WHAT TYPES OF ALLERGIES ARE THERE? There are many types of allergies. Common types include:  Seasonal allergies. People with this type of allergy are usually allergic to substances that are only present during certain seasons, such as molds and pollens.  Food allergies.  Drug allergies.  Insect allergies.  Animal dander allergies. WHAT ARE SYMPTOMS OF ALLERGIES? Possible allergy symptoms include:  Swelling of the lips, face, tongue, mouth, or throat.  Sneezing, coughing, or wheezing.  Nasal congestion.  Tingling in the mouth.  Rash.  Itching.  Itchy, red, swollen areas of skin (hives).  Watery  eyes.  Vomiting.  Diarrhea.  Dizziness.  Lightheadedness.  Fainting.  Trouble breathing or swallowing.  Chest tightness.  Rapid heartbeat. HOW ARE ALLERGIES DIAGNOSED? Allergies are diagnosed with a medical and family history and one or more of the following:  Skin tests.  Blood tests.  A food diary. A food diary is a record of all the foods and drinks you have in a day and of all the symptoms you experience.  The results of an elimination diet. An elimination diet involves eliminating foods from your diet and then adding them back in one by one to find out if a certain food causes an allergic reaction. HOW ARE ALLERGIES TREATED? There is no cure for allergies, but allergic reactions can be treated with medicine. Severe reactions usually need to be treated at a hospital. HOW CAN REACTIONS BE PREVENTED? The best way to prevent an allergic reaction is by avoiding the substance you are allergic to. Allergy shots and medicines can also help prevent reactions in some cases. People with severe allergic reactions may be able to prevent a life-threatening reaction called anaphylaxis with a medicine given right after exposure to the allergen.   This information is not intended to replace advice given to you by your health care provider. Make sure you discuss any questions you have with your health care provider.   Document Released: 07/22/2002 Document Revised: 05/19/2014 Document Reviewed: 02/07/2014 Elsevier Interactive Patient Education [employer]2016 Elsevier Inc.

## 2015-08-05 NOTE — ED Provider Notes (Signed)
CSN: [id number]     Arrival date & time 08/04/15  2248 History  By signing my name below, I, Jeffery Thompson, attest that this documentation has been prepared under the direction and in the presence of Jeffery AlbeIva Iann Rodier, MD at 0246 AM. Electronically Signed: Phillis HaggisGabriella Thompson, ED Scribe. 08/05/2015. 2:55 AM.   Chief Complaint  Patient presents with  . Allergic Reaction   The history is provided by the patient. No language interpreter was used.  HPI Comments: Jeffery Thompson is a 45 y.o. male who presents to the Emergency Department complaining of a possible allergic reaction onset earlier this evening. Pt states that he ate a cocktail shrimp platter from [employer] then an hour later, had hives and itching on his hands, neck, head, and back. He reports the worst itching is occuring on his left forehead and right neck. He states that he has never had that specific brand of shrimp, but eats shrimp regularly, several times a week.  He reports taking 1 tablespoon of liquid benadryl for his symptoms which helped. He states that the symptoms relieved in the waiting room, but feels like his hives are coming back. Pt is a former smoker. He is allergic to Tetracycline. He denies SOB, facial swelling, oral swelling, trouble swallowing or breathing, or mouth itching. He has never had this type of reaction before.   PCP: Jeffery Thompson,Jeffery ROBERT, MD  Past Medical History  Diagnosis Date  . Low testosterone   . H/O hiatal hernia   . GERD (gastroesophageal reflux disease)    Past Surgical History  Procedure Laterality Date  . Laparoscopic appendectomy  07/09/2011    Procedure: APPENDECTOMY LAPAROSCOPIC;  Surgeon: Almond LintFaera Byerly, MD;  Location: [facility] OR;  Service: General;;  . Cholecystectomy  2011 or 2012  . Foot surgery  2010 or 2011    Burr removed from big toe on right foot   Family History  Problem Relation Age of Onset  . Cancer Father     pancreatic  . Pulmonary fibrosis Mother    Social History  Substance Use  Topics  . Smoking status: Current Every Day Smoker -- 0.50 packs/day for 1 years    Types: Cigarettes  . Smokeless tobacco: Never Used  . Alcohol Use: Yes     Comment: very rarely     Review of Systems  HENT: Negative for facial swelling and trouble swallowing.   Eyes: Positive for itching.  Respiratory: Negative for shortness of breath.   Skin: Positive for rash.  All other systems reviewed and are negative.  Allergies  Tetracycline  Home Medications   Prior to Admission medications   Medication Sig Start Date End Date Taking? Authorizing Provider  cholecalciferol (VITAMIN D) 1000 units tablet Take 1,000 Units by mouth daily.   Yes Historical Provider, MD  emtricitabine-tenofovir (TRUVADA) 200-300 MG tablet Take 1 tablet by mouth daily.   Yes Historical Provider, MD  escitalopram (LEXAPRO) 10 MG tablet Take 10 mg by mouth daily.   Yes Historical Provider, MD  fluticasone (FLONASE) 50 MCG/ACT nasal spray Place 1 spray into both nostrils daily.   Yes Historical Provider, MD  naproxen (NAPROSYN) 500 MG tablet Take 1 tablet (500 mg total) by mouth 2 (two) times daily. 07/29/15  Yes Jeffery MaduraKelly Humes, PA-C  omeprazole (PRILOSEC) 20 MG capsule Take 20 mg by mouth daily before breakfast.    Yes Historical Provider, MD  oxyCODONE-acetaminophen (PERCOCET/ROXICET) 5-325 MG tablet Take 1-2 tablets by mouth every 6 (six) hours as needed for severe pain.  07/29/15  Yes Jeffery Madura, PA-C  testosterone (ANDROGEL) 50 MG/5GM GEL Place 5 g onto the skin daily. Applies to shoulders   Yes Historical Provider, MD  zolpidem (AMBIEN) 10 MG tablet Take 10 mg by mouth at bedtime as needed for sleep.   Yes Historical Provider, MD  famotidine (PEPCID) 20 MG tablet Take 1 tablet (20 mg total) by mouth 2 (two) times daily. 08/05/15   Jeffery Albe, MD  predniSONE (DELTASONE) 20 MG tablet Take 3 po QD x 3d , then 2 po QD x 3d then 1 po QD x 3d 08/05/15   Jeffery Albe, MD   BP 144/100 mmHg  Pulse 69  Temp(Src) 98.2 F (36.8 C)  (Oral)  Resp 19  Ht  (1.753 m)  Wt 212 lb 9.6 oz (96.435 kg)  BMI 31.38 kg/m2  SpO2 96%  Vital signs normal except for hypertension  Physical Exam  Constitutional: He is oriented to person, place, and time. He appears well-developed and well-nourished.  Non-toxic appearance. He does not appear ill. No distress.  HENT:  Head: Normocephalic and atraumatic.  Right Ear: External ear normal.  Left Ear: External ear normal.  Nose: Nose normal. No mucosal edema or rhinorrhea.  Mouth/Throat: Oropharynx is clear and moist and mucous membranes are normal. No dental abscesses or uvula swelling.  Eyes: Conjunctivae and EOM are normal. Pupils are equal, round, and reactive to light.  Neck: Normal range of motion and full passive range of motion without pain. Neck supple.  Cardiovascular: Normal rate, regular rhythm and normal heart sounds.  Exam reveals no gallop and no friction rub.   No murmur heard. Pulmonary/Chest: Effort normal and breath sounds normal. No respiratory distress. He has no wheezes. He has no rhonchi. He has no rales. He exhibits no tenderness and no crepitus.  Abdominal: Soft. Normal appearance and bowel sounds are normal. He exhibits no distension. There is no tenderness. There is no rebound and no guarding.  Musculoskeletal: Normal range of motion. He exhibits no edema or tenderness.  Moves all extremities well.   Neurological: He is alert and oriented to person, place, and time. He has normal strength. No cranial nerve deficit.  Skin: Skin is warm, dry and intact. Rash noted. Rash is urticarial. No erythema. No pallor.  Few urticarial lesions on back and bilateral temples; diffuse redness on neck  Psychiatric: He has a normal mood and affect. His speech is normal and behavior is normal. His mood appears not anxious.  Nursing note and vitals reviewed.   ED Course  Procedures (including critical care time)  Medications  methylPREDNISolone sodium succinate  (SOLU-MEDROL) 125 mg/2 mL injection 125 mg (125 mg Intramuscular Given 08/05/15 0355)  diphenhydrAMINE (BENADRYL) injection 50 mg (50 mg Intramuscular Given 08/05/15 0354)  famotidine (PEPCID) tablet 20 mg (20 mg Oral Given 08/05/15 0355)  hydrOXYzine (ATARAX/VISTARIL) tablet 25 mg (25 mg Oral Given 08/05/15 0525)    DIAGNOSTIC STUDIES: Oxygen Saturation is 96% on RA, normal by my interpretation.    COORDINATION OF CARE: 2:51 AM-Discussed treatment plan which includes antihistamines with pt at bedside and pt agreed to plan.   3:44 AM- rechecked pt who states that the itching and redness around his throat has worsened. He continues to deny oral swelling, throat swelling, trouble swallowing, or SOB. Pt states that he has not received any medications yet.  5 AM recheck patient still has some itching, he still has some redness around his neck and the urticarial type lesions seem to be  a little more prominent. He was given Atarax.  Recheck at 6:50 AM patient is doing much better. The redness is all gone. He has a few rare faint urticarial lesions on his neck. He feels ready to be discharged. We discussed being very cautious with shrimp, patient states it's one of his favorite foods, however he may now have an allergy.   MDM   Final diagnoses:  Allergic reaction, initial encounter    New Prescriptions   FAMOTIDINE (PEPCID) 20 MG TABLET    Take 1 tablet (20 mg total) by mouth 2 (two) times daily.   PREDNISONE (DELTASONE) 20 MG TABLET    Take 3 po QD x 3d , then 2 po QD x 3d then 1 po QD x 3d    Plan discharge  Jeffery Albe, MD, FACEP   I personally performed the services described in this documentation, which was scribed in my presence. The recorded information has been reviewed and considered.  Jeffery Albe, MD, Concha Pyo, MD 08/05/15 [phone]

## 2015-08-05 NOTE — ED Notes (Signed)
Awake. Verbally responsive. A/O x4. Resp even and unlabored. No audible adventitious breath sounds noted. ABC's intact.  

## 2018-04-29 ENCOUNTER — Encounter (HOSPITAL_COMMUNITY): Payer: Self-pay

## 2018-04-29 ENCOUNTER — Other Ambulatory Visit: Payer: Self-pay

## 2018-04-29 ENCOUNTER — Emergency Department (HOSPITAL_COMMUNITY)
Admission: EM | Admit: 2018-04-29 | Discharge: 2018-04-29 | Disposition: A | Discharge status: Home / Self Care | Payer: 59 | Attending: Emergency Medicine | Admitting: Emergency Medicine

## 2018-04-29 DIAGNOSIS — Z87891 Personal history of nicotine dependence: Secondary | ICD-10-CM | POA: Insufficient documentation

## 2018-04-29 DIAGNOSIS — Z79899 Other long term (current) drug therapy: Secondary | ICD-10-CM | POA: Insufficient documentation

## 2018-04-29 DIAGNOSIS — R030 Elevated blood-pressure reading, without diagnosis of hypertension: Secondary | ICD-10-CM | POA: Diagnosis present

## 2018-04-29 DIAGNOSIS — J45909 Unspecified asthma, uncomplicated: Secondary | ICD-10-CM | POA: Diagnosis not present

## 2018-04-29 LAB — I-STAT CHEM 8, ED
BUN: 11 mg/dL (ref 6–20)
CALCIUM ION: 1.15 mmol/L (ref 1.15–1.40)
CHLORIDE: 102 mmol/L (ref 98–111)
Creatinine, Ser: 0.9 mg/dL (ref 0.61–1.24)
Glucose, Bld: 99 mg/dL (ref 70–99)
HCT: 51 % (ref 39.0–52.0)
Hemoglobin: 17.3 g/dL — ABNORMAL HIGH (ref 13.0–17.0)
Potassium: 4.3 mmol/L (ref 3.5–5.1)
SODIUM: 138 mmol/L (ref 135–145)
TCO2: 30 mmol/L (ref 22–32)

## 2018-04-29 MED ORDER — HYDROCHLOROTHIAZIDE 25 MG PO TABS: 25.0000 mg | ORAL_TABLET | Freq: Every day | ORAL | 0 refills | Status: DC

## 2018-04-29 MED ORDER — HYDROCHLOROTHIAZIDE 12.5 MG PO CAPS
25.0000 mg | ORAL_CAPSULE | Freq: Once | ORAL | Status: AC
  Administered 2018-04-29: 25 mg via ORAL
  Filled 2018-04-29: qty 2

## 2018-04-29 NOTE — ED Provider Notes (Signed)
[facility]-EMERGENCY DEPT Provider Note   CSN: [id number] Arrival date & time: 04/29/18  1036     History   Chief Complaint Chief Complaint  Patient presents with  . Hypertension    HPI Jeffery Thompson is a 47 y.o. male.  HPI Patient is a 47 year old male who began checking his blood pressure several days ago because a friend received a new blood pressure cuff.  He is concerned about his elevated readings he has had at home.  He is otherwise been rather asymptomatic without any significant symptoms.  Some mild changes in his vision for which she has an upcoming optometry appointment.  No headaches.  No weakness of his arms or legs.  No chest pain or shortness of breath.  Denies abdominal pain.  Patient called his primary care physician today recommended they come to the ER for further evaluation.  Review of the record demonstrates blood pressure readings in the 140s and 150s with diastolics in the 80s and 90s at his primary care doctor visits over the past year.   Past Medical History:  Diagnosis Date  . GERD (gastroesophageal reflux disease)   . H/O hiatal hernia   . Low testosterone     Patient Active Problem List   Diagnosis Date Noted  . ASTHMA 07/22/2007  . DYSPNEA 07/22/2007    Past Surgical History:  Procedure Laterality Date  . CHOLECYSTECTOMY  2011 or 2012  . FOOT SURGERY  2010 or 2011   Burr removed from big toe on right foot  . LAPAROSCOPIC APPENDECTOMY  07/09/2011   Procedure: APPENDECTOMY LAPAROSCOPIC;  Surgeon: Almond LintFaera Byerly, MD;  Location: [facility] OR;  Service: General;;        Home Medications    Prior to Admission medications   Medication Sig Start Date End Date Taking? Authorizing Provider  emtricitabine-tenofovir (TRUVADA) 200-300 MG tablet Take 1 tablet by mouth daily.   Yes [provider]  escitalopram (LEXAPRO) 10 MG tablet Take 10 mg by mouth daily.   Yes [provider]  fluticasone (FLONASE) 50 MCG/ACT  nasal spray Place 1 spray into both nostrils daily.   Yes [provider]  omeprazole (PRILOSEC) 20 MG capsule Take 40 mg by mouth daily before breakfast.    Yes [provider]  testosterone (ANDROGEL) 50 MG/5GM GEL Place 5 g onto the skin daily. Applies to shoulders   Yes [provider]  varenicline (CHANTIX) 1 MG tablet Take 1 mg by mouth 2 (two) times daily. 10/02/17  Yes [provider]  zolpidem (AMBIEN) 10 MG tablet Take 10 mg by mouth at bedtime as needed for sleep.   Yes [provider]  famotidine (PEPCID) 20 MG tablet Take 1 tablet (20 mg total) by mouth 2 (two) times daily. Patient not taking: Reported on 04/29/2018 08/05/15   Devoria AlbeKnapp, Iva, MD  hydrochlorothiazide (HYDRODIURIL) 25 MG tablet Take 1 tablet (25 mg total) by mouth daily. 04/29/18   Azalia Bilisampos, Clary Boulais, MD  naproxen (NAPROSYN) 500 MG tablet Take 1 tablet (500 mg total) by mouth 2 (two) times daily. Patient not taking: Reported on 04/29/2018 07/29/15   Antony MaduraHumes, Kelly, PA-C  oxyCODONE-acetaminophen (PERCOCET/ROXICET) 5-325 MG tablet Take 1-2 tablets by mouth every 6 (six) hours as needed for severe pain. Patient not taking: Reported on 04/29/2018 07/29/15   Antony MaduraHumes, Kelly, PA-C  predniSONE (DELTASONE) 20 MG tablet Take 3 po QD x 3d , then 2 po QD x 3d then 1 po QD x 3d Patient not taking: Reported  on 04/29/2018 08/05/15   Devoria AlbeKnapp, Iva, MD    Family History Family History  Problem Relation Age of Onset  . Pulmonary fibrosis Mother   . Cancer Father        pancreatic    Social History Social History   Tobacco Use  . Smoking status: Former Smoker    Packs/day: 0.50    Years: 1.00    Pack years: 0.50    Types: Cigarettes  . Smokeless tobacco: Never Used  Substance Use Topics  . Alcohol use: Yes    Comment: very rarely   . Drug use: No     Allergies   Tetracycline   Review of Systems Review of Systems  All other systems reviewed and are negative.    Physical Exam Updated  Vital Signs BP (!) 130/95   Pulse (!) 55   Temp 98.3 F (36.8 C) (Oral)   Resp 18   Ht 5\' 9"  (1.753 m)   Wt 93.9 kg   SpO2 98%   BMI 30.57 kg/m   Physical Exam Vitals signs and nursing note reviewed.  Constitutional:      Appearance: He is well-developed.  HENT:     Head: Normocephalic and atraumatic.  Neck:     Musculoskeletal: Normal range of motion.  Cardiovascular:     Rate and Rhythm: Normal rate and regular rhythm.     Heart sounds: Normal heart sounds.  Pulmonary:     Effort: Pulmonary effort is normal. No respiratory distress.     Breath sounds: Normal breath sounds.  Abdominal:     General: There is no distension.     Palpations: Abdomen is soft.     Tenderness: There is no abdominal tenderness.  Musculoskeletal: Normal range of motion.  Skin:    General: Skin is warm and dry.  Neurological:     Mental Status: He is alert and oriented to person, place, and time.  Psychiatric:        Judgment: Judgment normal.      ED Treatments / Results  Labs (all labs ordered are listed, but only abnormal results are displayed) Labs Reviewed  I-STAT CHEM 8, ED - Abnormal; Notable for the following components:      Result Value   Hemoglobin 17.3 (*)    All other components within normal limits    EKG None  Radiology No results found.  Procedures Procedures (including critical care time)  Medications Ordered in ED Medications  hydrochlorothiazide (MICROZIDE) capsule 25 mg (25 mg Oral Given 04/29/18 1236)     Initial Impression / Assessment and Plan / ED Course  I have reviewed the triage vital signs and the nursing notes.  Pertinent labs & imaging results that were available during my care of the patient were reviewed by me and considered in my medical decision making (see chart for details).     Well-appearing.  Rather asymptomatic elevated blood pressure reading.  Lifestyle changes.  Hydrochlorothiazide.  Close primary care follow-up.   Final  Clinical Impressions(s) / ED Diagnoses   Final diagnoses:  Elevated blood pressure reading    ED Discharge Orders         Ordered    hydrochlorothiazide (HYDRODIURIL) 25 MG tablet  Daily     04/29/18 1302           Azalia Bilisampos, Deah Ottaway, MD 04/29/18 1320

## 2018-04-29 NOTE — ED Triage Notes (Signed)
Patient reports that he has been having hypertension x 2 days. patient has been calling his PCP and was told to come to the ED. BP in triage 153/95.

## 2018-04-29 NOTE — Discharge Instructions (Addendum)
Please follow-up with your primary care physician regarding your elevated blood pressure reading today and at home.  Exercise daily Eat healthy food Avoid fast food Avoid restaurants

## 2018-11-18 ENCOUNTER — Encounter (HOSPITAL_COMMUNITY): Payer: Self-pay

## 2018-11-18 ENCOUNTER — Other Ambulatory Visit: Payer: Self-pay

## 2018-11-18 ENCOUNTER — Emergency Department (HOSPITAL_COMMUNITY)
Admission: EM | Admit: 2018-11-18 | Discharge: 2018-11-18 | Disposition: A | Discharge status: Home / Self Care | Payer: 59 | Attending: Emergency Medicine | Admitting: Emergency Medicine

## 2018-11-18 ENCOUNTER — Emergency Department (HOSPITAL_COMMUNITY): Payer: 59

## 2018-11-18 DIAGNOSIS — Z79899 Other long term (current) drug therapy: Secondary | ICD-10-CM | POA: Insufficient documentation

## 2018-11-18 DIAGNOSIS — R0789 Other chest pain: Secondary | ICD-10-CM | POA: Diagnosis not present

## 2018-11-18 DIAGNOSIS — Z87891 Personal history of nicotine dependence: Secondary | ICD-10-CM | POA: Insufficient documentation

## 2018-11-18 DIAGNOSIS — J45909 Unspecified asthma, uncomplicated: Secondary | ICD-10-CM | POA: Diagnosis not present

## 2018-11-18 LAB — BASIC METABOLIC PANEL
Anion gap: 9 (ref 5–15)
BUN: 10 mg/dL (ref 6–20)
CO2: 24 mmol/L (ref 22–32)
Calcium: 8.3 mg/dL — ABNORMAL LOW (ref 8.9–10.3)
Chloride: 104 mmol/L (ref 98–111)
Creatinine, Ser: 0.99 mg/dL (ref 0.61–1.24)
GFR calc Af Amer: >60 mL/min (ref >60)
GFR calc non Af Amer: >60 mL/min (ref >60)
Glucose, Bld: 110 mg/dL — ABNORMAL HIGH (ref 70–99)
Potassium: 4.1 mmol/L (ref 3.5–5.1)
Sodium: 137 mmol/L (ref 135–145)

## 2018-11-18 LAB — CBC
HCT: 47.6 % (ref 39.0–52.0)
Hemoglobin: 16.7 g/dL (ref 13.0–17.0)
MCH: 32.7 pg (ref 26.0–34.0)
MCHC: 35.1 g/dL (ref 30.0–36.0)
MCV: 93.2 fL (ref 80.0–100.0)
Platelets: 172 10*3/uL (ref 150–400)
RBC: 5.11 MIL/uL (ref 4.22–5.81)
RDW: 13.4 % (ref 11.5–15.5)
WBC: 6.1 10*3/uL (ref 4.0–10.5)
nRBC: 0 % (ref 0.0–0.2)

## 2018-11-18 LAB — TROPONIN I (HIGH SENSITIVITY)
Troponin I (High Sensitivity): 5 ng/L (ref <18)
Troponin I (High Sensitivity): 5 ng/L (ref <18)

## 2018-11-18 LAB — D-DIMER, QUANTITATIVE (NOT AT ARMC): D-Dimer, Quant: 0.31 ug/mL-FEU (ref 0.00–0.50)

## 2018-11-18 MED ORDER — ALUM & MAG HYDROXIDE-SIMETH 200-200-20 MG/5ML PO SUSP
30.0000 mL | Freq: Once | ORAL | Status: AC
  Administered 2018-11-18: 30 mL via ORAL
  Filled 2018-11-18: qty 30

## 2018-11-18 MED ORDER — LIDOCAINE VISCOUS HCL 2 % MT SOLN
15.0000 mL | Freq: Once | OROMUCOSAL | Status: AC
  Administered 2018-11-18: 15 mL via ORAL
  Filled 2018-11-18: qty 15

## 2018-11-18 NOTE — ED Provider Notes (Signed)
[facility] Provider Note  CSN: [id number] Arrival date & time: 11/18/18 0006  Chief Complaint(s) Chest Pain and Leg Swelling  HPI Jeffery Thompson is a 48 y.o. male   The history is provided by the patient.  Chest Pain Pain location:  L chest Pain quality: pressure   Pain radiates to:  Mid back Pain severity:  Moderate Onset quality:  Gradual Duration:  20 hours Timing:  Constant Progression:  Unchanged Chronicity:  Recurrent Relieved by:  Nothing Worsened by:  Nothing Associated symptoms: lower extremity edema (intermittent for 2 days; resolved)   Associated symptoms: no altered mental status, no cough, no fatigue, no fever, no nausea, no shortness of breath and no vomiting   Risk factors: hypertension and male sex   Risk factors: no coronary artery disease, no diabetes mellitus, no high cholesterol, no immobilization and no smoking     Past Medical History Past Medical History:  Diagnosis Date  . GERD (gastroesophageal reflux disease)   . H/O hiatal hernia   . Low testosterone    Patient Active Problem List   Diagnosis Date Noted  . ASTHMA 07/22/2007  . DYSPNEA 07/22/2007   Home Medication(s) Prior to Admission medications   Medication Sig Start Date End Date Taking? Authorizing Provider  emtricitabine-tenofovir (TRUVADA) 200-300 MG tablet Take 1 tablet by mouth at bedtime.    Yes [provider]  escitalopram (LEXAPRO) 10 MG tablet Take 10 mg by mouth at bedtime.    Yes [provider]  hydrochlorothiazide (HYDRODIURIL) 25 MG tablet Take 1 tablet (25 mg total) by mouth daily. 04/29/18  Yes Azalia Bilisampos, Kevin, MD  ibuprofen (ADVIL) 200 MG tablet Take 400 mg by mouth every 6 (six) hours as needed for headache.   Yes [provider]  omeprazole (PRILOSEC) 40 MG capsule Take 40 mg by mouth daily. 10/19/18  Yes [provider]  testosterone (TESTIM) 50 MG/5GM (1%) GEL Apply 1 application topically daily.  09/20/18  Yes [provider]  zolpidem (AMBIEN) 10 MG tablet Take 10 mg by mouth at bedtime as needed for sleep.   Yes [provider]  famotidine (PEPCID) 20 MG tablet Take 1 tablet (20 mg total) by mouth 2 (two) times daily. Patient not taking: Reported on 04/29/2018 08/05/15   Devoria AlbeKnapp, Iva, MD  naproxen (NAPROSYN) 500 MG tablet Take 1 tablet (500 mg total) by mouth 2 (two) times daily. Patient not taking: Reported on 04/29/2018 07/29/15   Antony MaduraHumes, Kelly, PA-C  oxyCODONE-acetaminophen (PERCOCET/ROXICET) 5-325 MG tablet Take 1-2 tablets by mouth every 6 (six) hours as needed for severe pain. Patient not taking: Reported on 04/29/2018 07/29/15   Antony MaduraHumes, Kelly, PA-C  predniSONE (DELTASONE) 20 MG tablet Take 3 po QD x 3d , then 2 po QD x 3d then 1 po QD x 3d Patient not taking: Reported on 04/29/2018 08/05/15   Devoria AlbeKnapp, Iva, MD  Past Surgical History Past Surgical History:  Procedure Laterality Date  . CHOLECYSTECTOMY  2011 or 2012  . FOOT SURGERY  2010 or 2011   Burr removed from big toe on right foot  . LAPAROSCOPIC APPENDECTOMY  07/09/2011   Procedure: APPENDECTOMY LAPAROSCOPIC;  Surgeon: Almond LintFaera Byerly, MD;  Location: [facility] OR;  Service: General;;   Family History Family History  Problem Relation Age of Onset  . Pulmonary fibrosis Mother   . Cancer Father        pancreatic    Social History Social History   Tobacco Use  . Smoking status: Former Smoker    Packs/day: 0.50    Years: 1.00    Pack years: 0.50    Types: Cigarettes  . Smokeless tobacco: Never Used  Substance Use Topics  . Alcohol use: Yes    Comment: very rarely   . Drug use: No   Allergies Tetracycline  Review of Systems Review of Systems  Constitutional: Negative for fatigue and fever.  Respiratory: Negative for cough and shortness of breath.   Cardiovascular: Positive for  chest pain.  Gastrointestinal: Negative for nausea and vomiting.   All other systems are reviewed and are negative for acute change except as noted in the HPI  Physical Exam Vital Signs  I have reviewed the triage vital signs BP (!) 162/103 (BP Location: Left Arm)   Pulse 60   Temp 98.8 F (37.1 C) (Oral)   Resp 15   Ht 5\' 9"  (1.753 m)   Wt 93 kg   SpO2 99%   BMI 30.27 kg/m   Physical Exam Vitals signs reviewed.  Constitutional:      General: He is not in acute distress.    Appearance: He is well-developed. He is not diaphoretic.  HENT:     Head: Normocephalic and atraumatic.     Nose: Nose normal.  Eyes:     General: No scleral icterus.       Right eye: No discharge.        Left eye: No discharge.     Conjunctiva/sclera: Conjunctivae normal.     Pupils: Pupils are equal, round, and reactive to light.  Neck:     Musculoskeletal: Normal range of motion and neck supple.  Cardiovascular:     Rate and Rhythm: Normal rate and regular rhythm.     Heart sounds: No murmur. No friction rub. No gallop.   Pulmonary:     Effort: Pulmonary effort is normal. No respiratory distress.     Breath sounds: Normal breath sounds. No stridor. No rales.  Abdominal:     General: There is no distension.     Palpations: Abdomen is soft.     Tenderness: There is no abdominal tenderness.  Musculoskeletal:        General: No tenderness.     Right lower leg: No edema.     Left lower leg: No edema.  Skin:    General: Skin is warm and dry.     Findings: No erythema or rash.  Neurological:     Mental Status: He is alert and oriented to person, place, and time.     ED Results and Treatments Labs (all labs ordered are listed, but only abnormal results are displayed) Labs Reviewed  BASIC METABOLIC PANEL - Abnormal; Notable for the following components:      Result Value   Glucose, Bld 110 (*)    Calcium 8.3 (*)    All other components within normal limits  CBC  TROPONIN I (HIGH  SENSITIVITY)  D-DIMER, QUANTITATIVE (NOT AT [facility])  TROPONIN I (HIGH SENSITIVITY)                                                                                                                         EKG  EKG Interpretation  Date/Time:  Thursday November 18 2018 00:44:24 EDT Ventricular Rate:  59 PR Interval:    QRS Duration: 94 QT Interval:  419 QTC Calculation: 415 R Axis:   42 Text Interpretation:  Sinus rhythm Borderline T wave abnormalities NO STEMI. No old tracing to compare Confirmed by Addison Lank [phone]) on 11/18/2018 3:18:07 AM      Radiology Dg Chest 2 View  Result Date: 11/18/2018 CLINICAL DATA:  Chest tightness EXAM: CHEST - 2 VIEW COMPARISON:  July 29, 2015. FINDINGS: Is mild cardiac enlargement. There is no overt pulmonary edema or volume overload. No pneumothorax. No large pleural effusion. No focal area of consolidation. No acute osseous abnormality. IMPRESSION: No active cardiopulmonary disease. Electronically Signed   By: Constance Holster M.D.   On: 11/18/2018 01:09    Pertinent labs & imaging results that were available during my care of the patient were reviewed by me and considered in my medical decision making (see chart for details).  Medications Ordered in ED Medications  alum & mag hydroxide-simeth (MAALOX/MYLANTA) 200-200-20 MG/5ML suspension 30 mL (30 mLs Oral Given 11/18/18 0448)    And  lidocaine (XYLOCAINE) 2 % viscous mouth solution 15 mL (15 mLs Oral Given 11/18/18 0449)                                                                                                                                    Procedures Procedures  (including critical care time)  Medical Decision Making / ED Course I have reviewed the nursing notes for this encounter and the patient's prior records (if available in EHR or on provided paperwork).   KORVER GRAYBEAL was evaluated in Emergency Department on 11/18/2018 for the symptoms described in the history of present  illness. He was evaluated in the context of the global COVID-19 pandemic, which necessitated consideration that the patient might be at risk for infection with the SARS-CoV-2 virus that causes COVID-19. Institutional protocols and algorithms that pertain to the evaluation of patients at risk for COVID-19 are in a state of rapid change based on information released by regulatory bodies including  the [employer] and federal and state organizations. These policies and algorithms were followed during the patient's care in the ED.  Atypical chest pain that is been constant for 20 hours.  EKG with nonspecific T wave changes.  No prior for comparison.  No evidence of pericarditis.  Initial troponin negative.  Low suspicion for ACS but will obtain a delta troponin.  This was negative.  Low pretest probability for PE.  Dimer negative.  Presentation not classic for aortic dissection or esophageal perforation.  Chest x-ray without evidence suggestive of pneumonia, pneumothorax, pneumomediastinum.  No abnormal contour of the mediastinum to suggest dissection. No evidence of acute injuries.  Possible GI related given patient's history of hiatal hernia.  Given GI cocktail which did provide significant relief.  The patient appears reasonably screened and/or stabilized for discharge and I doubt any other medical condition or other [facility] requiring further screening, evaluation, or treatment in the ED at this time prior to discharge.  The patient is safe for discharge with strict return precautions.       Final Clinical Impression(s) / ED Diagnoses Final diagnoses:  Atypical chest pain    The patient appears reasonably screened and/or stabilized for discharge and I doubt any other medical condition or other [facility] requiring further screening, evaluation, or treatment in the ED at this time prior to discharge.  Disposition: Discharge  Condition: Good  I have discussed the results, Dx and Tx plan with the patient who  expressed understanding and agree(s) with the plan. Discharge instructions discussed at great length. The patient was given strict return precautions who verbalized understanding of the instructions. No further questions at time of discharge.    ED Discharge Orders    None        Follow Up: Joette CatchingNyland, Leonard, MD [address] [place] [id number] [phone]  Schedule an appointment as soon as possible for a visit        This chart was dictated using voice recognition software.  Despite best efforts to proofread,  errors can occur which can change the documentation meaning.   Nira Connardama, Pedro Eduardo, MD 11/18/18 [phone]

## 2018-11-18 NOTE — ED Triage Notes (Signed)
Pt arrived stating that starting this morning his feet and ankles were swollen, progressing through the day he felt pressure in his chest. By evening he began having back pain. Pt states he is supposed to take [facility] and stopped taking it.

## 2019-04-20 ENCOUNTER — Other Ambulatory Visit: Payer: Self-pay

## 2019-04-20 DIAGNOSIS — Z20822 Contact with and (suspected) exposure to covid-19: Secondary | ICD-10-CM

## 2019-04-21 LAB — NOVEL CORONAVIRUS, NAA: SARS-CoV-2, NAA: NOT DETECTED

## 2020-01-11 IMAGING — CR CHEST - 2 VIEW
2 series · 2 of 2 positions shown · non-contrast
Comparison: July 29, 2015.

CLINICAL DATA: Chest tightness

EXAM:
CHEST - 2 VIEW

[w chest pa]
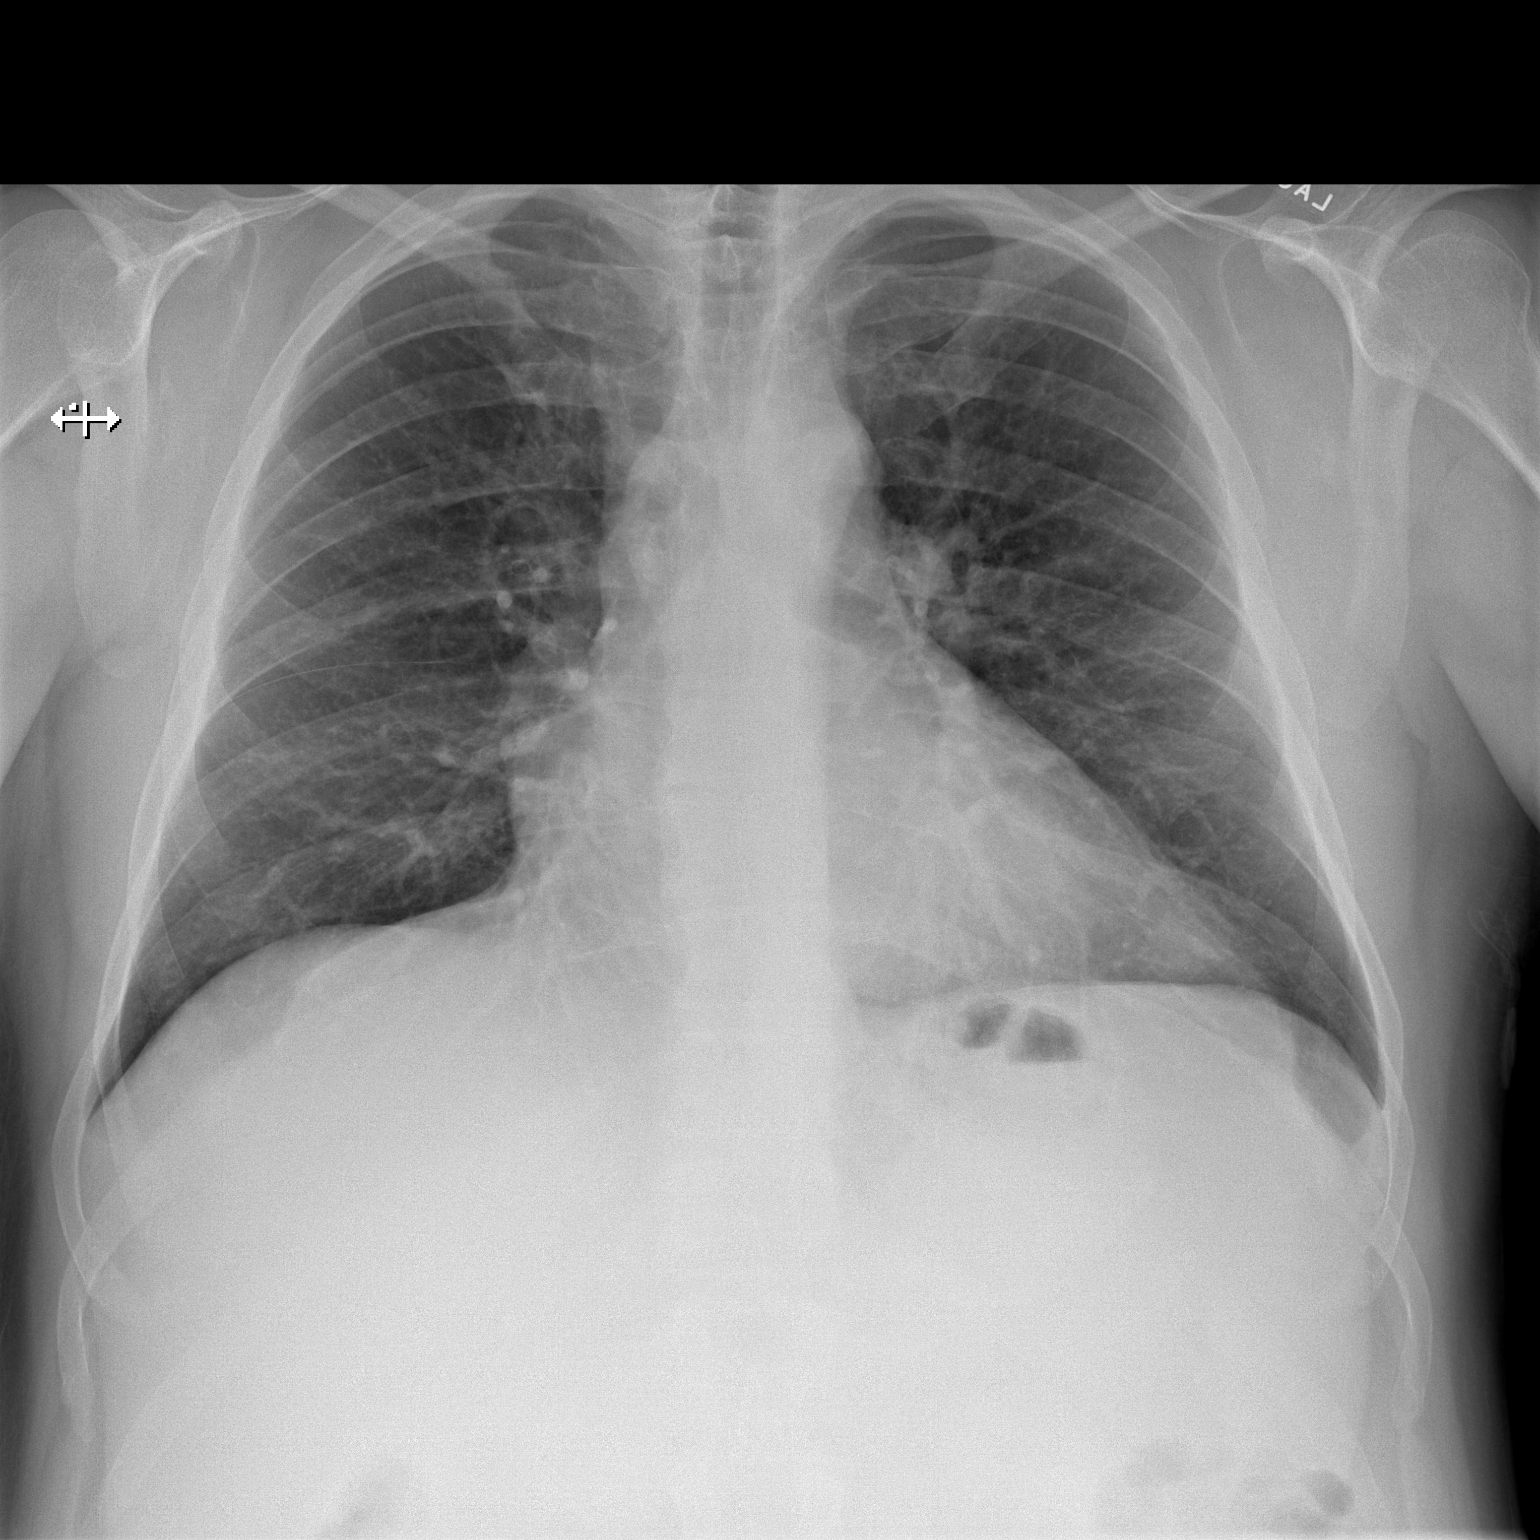

[w chest lat]
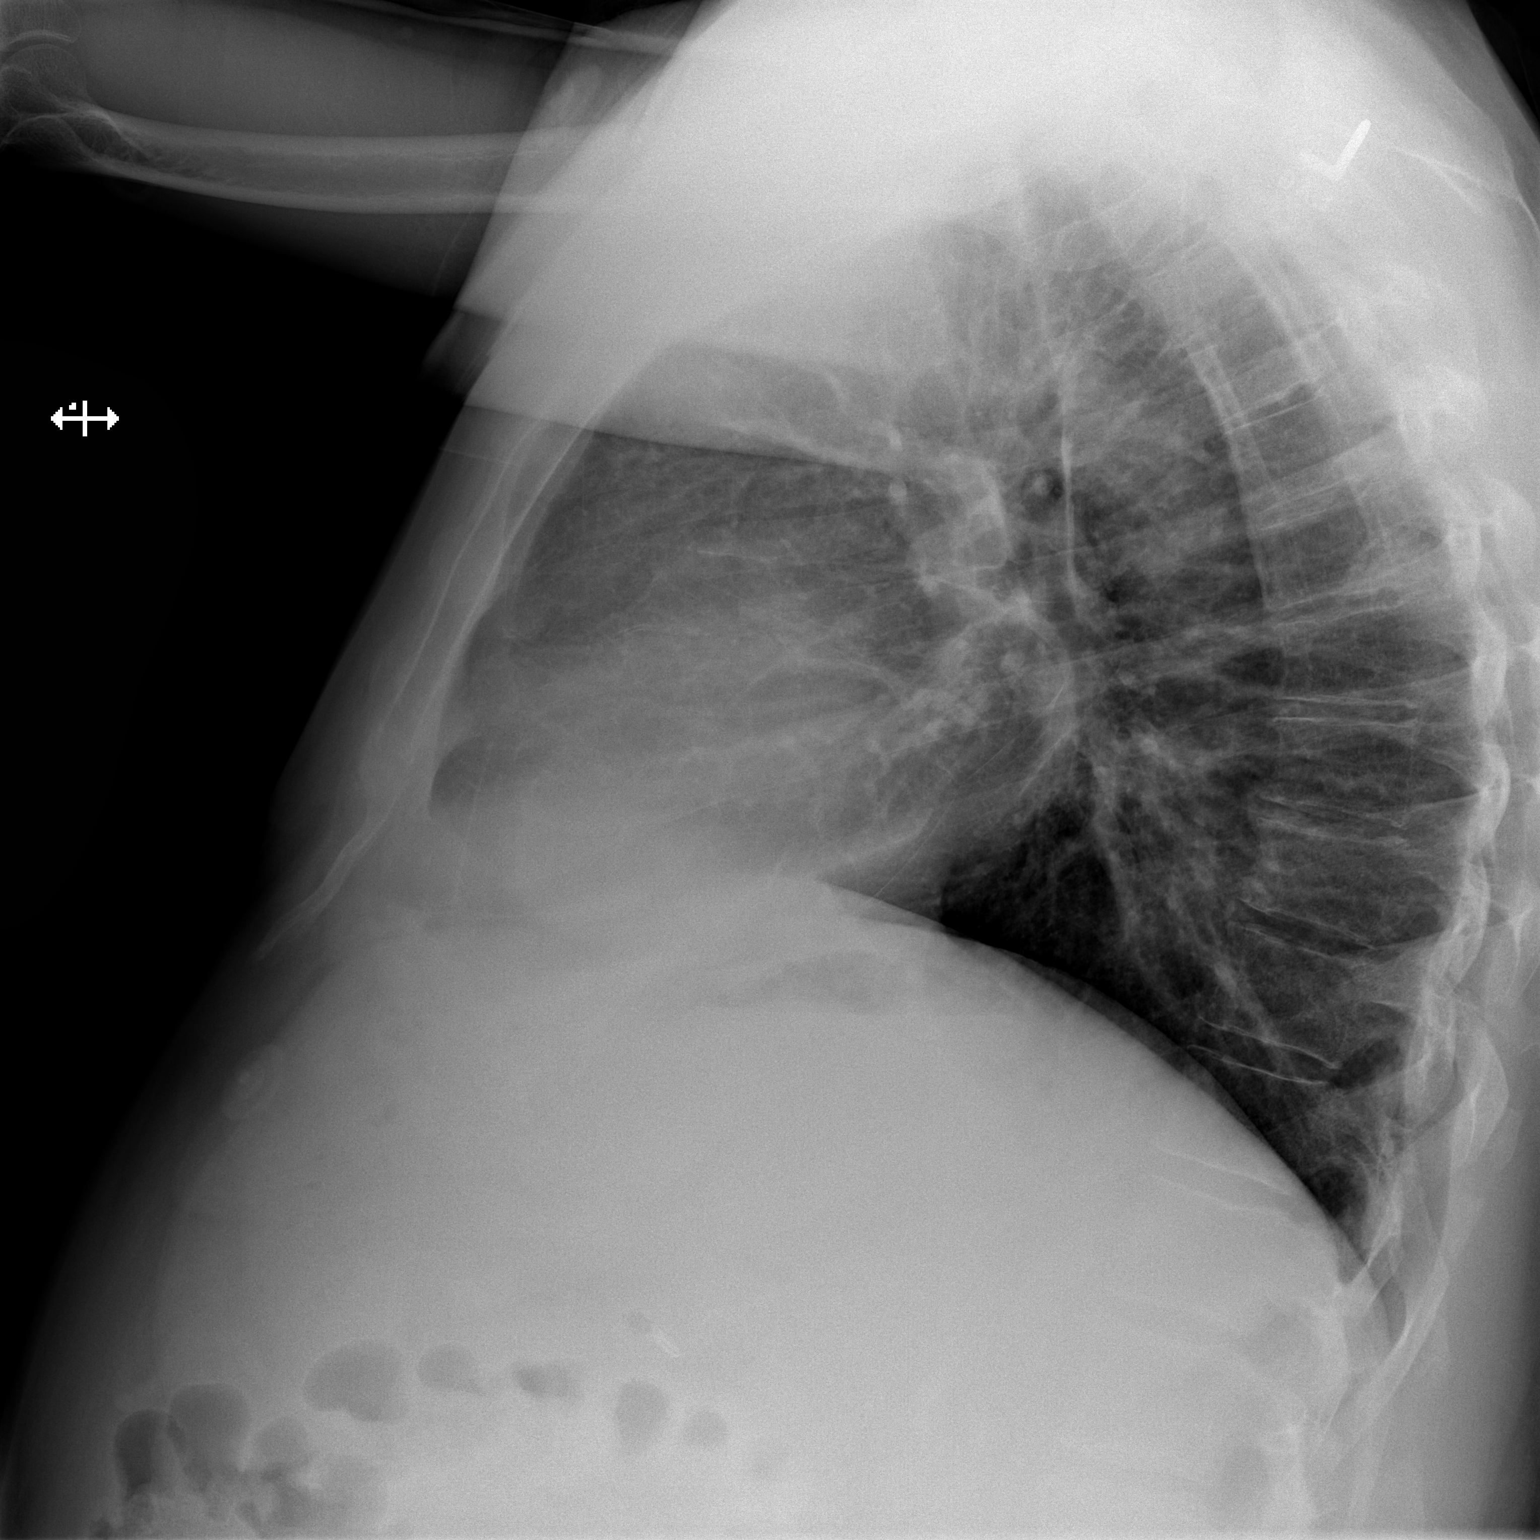

[2 of 2 positions shown; findings below may reference images not displayed]

FINDINGS: Is mild cardiac enlargement. There is no overt pulmonary edema or
volume overload. No pneumothorax. No large pleural effusion. No
focal area of consolidation. No acute osseous abnormality.
IMPRESSION: No active cardiopulmonary disease.

## 2021-09-06 ENCOUNTER — Ambulatory Visit (HOSPITAL_COMMUNITY)
Admission: EM | Admit: 2021-09-06 | Discharge: 2021-09-06 | Disposition: A | Discharge status: Home / Self Care | Payer: Managed Care, Other (non HMO) | Attending: Family Medicine | Admitting: Family Medicine

## 2021-09-06 ENCOUNTER — Other Ambulatory Visit: Payer: Self-pay

## 2021-09-06 ENCOUNTER — Encounter (HOSPITAL_COMMUNITY): Payer: Self-pay | Admitting: *Deleted

## 2021-09-06 DIAGNOSIS — L0291 Cutaneous abscess, unspecified: Secondary | ICD-10-CM | POA: Insufficient documentation

## 2021-09-06 MED ORDER — SULFAMETHOXAZOLE-TRIMETHOPRIM 800-160 MG PO TABS: 1.0000 | ORAL_TABLET | Freq: Two times a day (BID) | ORAL | 0 refills | Status: AC

## 2021-09-06 MED ORDER — LIDOCAINE-EPINEPHRINE 1 %-1:100000 IJ SOLN
INTRAMUSCULAR | Status: AC
  Filled 2021-09-06: qty 1

## 2021-09-06 NOTE — Discharge Instructions (Signed)
You were seen today for an abscess.  ?This was opened and drained today.  I have sent out an antibiotic for you to take twice/day x 7 days.  If we need to change this based on the culture results we will notify you.  ?Please keep the area clean and dry.  Keep it covered while it drains.   ?If you have worsening pain, redness, or swelling then please return for re-evaluation.  ?

## 2021-09-06 NOTE — ED Provider Notes (Signed)
?[facility] ? ? ? ?CSN: [id number] ?Arrival date & time: 09/06/21  0946 ? ? ?  ? ?History   ?Chief Complaint ?Chief Complaint  ?Patient presents with  ? Abscess  ? ? ?HPI ?Jeffery Thompson is a 51 y.o. male.  ? ?Here for an abscess under the chin.  ?He noted it about 2 weeks ago, was fairly large, but not painful or sore.  Yesterday he woke up and it was more red, swollen, and painful.  Today is more painful when moves his head, swallows, etc.  ?No fevers/chills.  ? ?Past Medical History:  ?Diagnosis Date  ? GERD (gastroesophageal reflux disease)   ? H/O hiatal hernia   ? Low testosterone   ? ? ?Patient Active Problem List  ? Diagnosis Date Noted  ? ASTHMA 07/22/2007  ? DYSPNEA 07/22/2007  ? ? ?Past Surgical History:  ?Procedure Laterality Date  ? CHOLECYSTECTOMY  2011 or 2012  ? FOOT SURGERY  2010 or 2011  ? Burr removed from big toe on right foot  ? LAPAROSCOPIC APPENDECTOMY  07/09/2011  ? Procedure: APPENDECTOMY LAPAROSCOPIC;  Surgeon: Almond Lint, MD;  [facility]: [facility];  Service: General;;  ? ? ? ? ? ?Home Medications   ? ?Prior to Admission medications   ?Medication Sig Start Date End Date Taking? Authorizing Provider  ?emtricitabine-tenofovir (TRUVADA) 200-300 MG tablet Take 1 tablet by mouth at bedtime.     [provider]  ?escitalopram (LEXAPRO) 10 MG tablet Take 10 mg by mouth at bedtime.     [provider]  ?famotidine (PEPCID) 20 MG tablet Take 1 tablet (20 mg total) by mouth 2 (two) times daily. ?Patient not taking: Reported on 04/29/2018 08/05/15   Devoria Albe, MD  ?hydrochlorothiazide (HYDRODIURIL) 25 MG tablet Take 1 tablet (25 mg total) by mouth daily. 04/29/18   Azalia Bilis, MD  ?ibuprofen (ADVIL) 200 MG tablet Take 400 mg by mouth every 6 (six) hours as needed for headache.    [provider]  ?naproxen (NAPROSYN) 500 MG tablet Take 1 tablet (500 mg total) by mouth 2 (two) times daily. ?Patient not taking: Reported on 04/29/2018 07/29/15   Antony Madura, PA-C   ?omeprazole (PRILOSEC) 40 MG capsule Take 40 mg by mouth daily. 10/19/18   [provider]  ?oxyCODONE-acetaminophen (PERCOCET/ROXICET) 5-325 MG tablet Take 1-2 tablets by mouth every 6 (six) hours as needed for severe pain. ?Patient not taking: Reported on 04/29/2018 07/29/15   Antony Madura, PA-C  ?predniSONE (DELTASONE) 20 MG tablet Take 3 po QD x 3d , then 2 po QD x 3d then 1 po QD x 3d ?Patient not taking: Reported on 04/29/2018 08/05/15   Devoria Albe, MD  ?testosterone (TESTIM) 50 MG/5GM (1%) GEL Apply 1 application topically daily. 09/20/18   [provider]  ?zolpidem (AMBIEN) 10 MG tablet Take 10 mg by mouth at bedtime as needed for sleep.    [provider]  ? ? ?Family History ?Family History  ?Problem Relation Age of Onset  ? Pulmonary fibrosis Mother   ? Cancer Father   ?     pancreatic  ? ? ?Social History ?Social History  ? ?Tobacco Use  ? Smoking status: Former  ?  Packs/day: 0.50  ?  Years: 1.00  ?  Pack years: 0.50  ?  Types: Cigarettes  ? Smokeless tobacco: Never  ?Vaping Use  ? Vaping Use: Never used  ?Substance Use Topics  ? Alcohol use: Yes  ?  Comment: very rarely   ?  Drug use: No  ? ? ? ?Allergies   ?Tetracycline ? ? ?Review of Systems ?Review of Systems  ?Constitutional: Negative.   ?HENT: Negative.    ?Respiratory: Negative.    ?Cardiovascular: Negative.   ?Gastrointestinal: Negative.   ?Genitourinary: Negative.   ? ? ?Physical Exam ?Triage Vital Signs ?ED Triage Vitals  ?Enc Vitals Group  ?   BP 09/06/21 1002 122/76  ?   Pulse Rate 09/06/21 1002 73  ?   Resp 09/06/21 1002 18  ?   Temp 09/06/21 1002 99.3 ?F (37.4 ?C)  ?   Temp src --   ?   SpO2 09/06/21 1002 98 %  ?   Weight --   ?   Height --   ?   Head Circumference --   ?   Peak Flow --   ?   Pain Score 09/06/21 1000 4  ?   Pain Loc --   ?   Pain Edu? --   ?   Excl. in GC? --   ? ?No data found. ? ?Updated Vital Signs ?BP 122/76   Pulse 73   Temp 99.3 ?F (37.4 ?C)   Resp 18   SpO2 98%  ? ?Visual Acuity ?Right Eye  Distance:   ?Left Eye Distance:   ?Bilateral Distance:   ? ?Right Eye Near:   ?Left Eye Near:    ?Bilateral Near:    ? ?Physical Exam ?Constitutional:   ?   Appearance: Normal appearance.  ?Cardiovascular:  ?   Rate and Rhythm: Normal rate and regular rhythm.  ?Pulmonary:  ?   Effort: Pulmonary effort is normal.  ?Musculoskeletal:  ?   Cervical back: Normal range of motion and neck supple.  ?Lymphadenopathy:  ?   Cervical: No cervical adenopathy.  ?Skin: ?   Comments: Under the chin, slightly to the right, is an area that is raised, hard, red, and tender;   ?Neurological:  ?   Mental Status: He is alert.  ? ? ? ?UC Treatments / Results  ?Labs ?(all labs ordered are listed, but only abnormal results are displayed) ?Labs Reviewed - No data to display ? ?EKG ? ? ?Radiology ?No results found. ? ?Procedures ?Incision and Drainage ? ?Date/Time: 09/06/2021 10:30 AM ?Performed by: Jannifer FranklinPiontek, Kyden Potash, MD ?Authorized by: Jannifer FranklinPiontek, Paulene Tayag, MD  ? ?Consent:  ?  Consent obtained:  Verbal ?  Consent given by:  Patient ?  Risks discussed:  Bleeding, incomplete drainage and pain ?  Alternatives discussed:  No treatment ?Location:  ?  Type:  Abscess ?  Location:  Neck ?Sedation:  ?  Sedation type:  None ?Anesthesia:  ?  Anesthesia method:  Local infiltration ?  Local anesthetic:  Lidocaine 1% WITH epi ?Procedure type:  ?  Complexity:  Simple ?Procedure details:  ?  Ultrasound guidance: no   ?  Incision types:  Single straight ?  Incision depth:  Dermal ?  Drainage:  Serosanguinous ?  Drainage amount:  Moderate ?  Wound treatment:  Wound left open ?  Packing materials:  None ?Post-procedure details:  ?  Procedure completion:  Tolerated (including critical care time) ? ?Medications Ordered in UC ?Medications - No data to display ? ?Initial Impression / Assessment and Plan / UC Course  ?I have reviewed the triage vital signs and the nursing notes. ? ?Pertinent labs & imaging results that were available during my care of the patient were reviewed  by me and considered in my medical decision making (see chart for  details). ? ?  ?Final Clinical Impressions(s) / UC Diagnoses  ? ?Final diagnoses:  ?Abscess  ? ? ? ?Discharge Instructions   ? ?  ?You were seen today for an abscess.  ?This was opened and drained today.  I have sent out an antibiotic for you to take twice/day x 7 days.  If we need to change this based on the culture results we will notify you.  ?Please keep the area clean and dry.  Keep it covered while it drains.   ?If you have worsening pain, redness, or swelling then please return for re-evaluation.  ? ? ? ?ED Prescriptions   ? ? Medication Sig Dispense Auth. Provider  ? sulfamethoxazole-trimethoprim (BACTRIM DS) 800-160 MG tablet Take 1 tablet by mouth 2 (two) times daily for 7 days. 14 tablet Jannifer Franklin, MD  ? ?  ? ?PDMP not reviewed this encounter. ?  ?Jannifer Franklin, MD ?09/06/21 1032 ? ?

## 2021-09-06 NOTE — ED Triage Notes (Signed)
Pt reports he first noticed bump on the underside of his chin 2 weeks ago. Pt now reports site is sore and red. ?

## 2021-09-08 LAB — AEROBIC CULTURE W GRAM STAIN (SUPERFICIAL SPECIMEN)

## 2021-11-19 ENCOUNTER — Emergency Department (HOSPITAL_COMMUNITY): Payer: Commercial Managed Care - HMO

## 2021-11-19 ENCOUNTER — Emergency Department (HOSPITAL_COMMUNITY)
Admission: EM | Admit: 2021-11-19 | Discharge: 2021-11-20 | Discharge status: Against Medical Advice | Payer: Commercial Managed Care - HMO | Attending: Emergency Medicine | Admitting: Emergency Medicine

## 2021-11-19 ENCOUNTER — Encounter (HOSPITAL_COMMUNITY): Payer: Self-pay

## 2021-11-19 ENCOUNTER — Other Ambulatory Visit: Payer: Self-pay

## 2021-11-19 DIAGNOSIS — R0789 Other chest pain: Secondary | ICD-10-CM | POA: Insufficient documentation

## 2021-11-19 DIAGNOSIS — Z5321 Procedure and treatment not carried out due to patient leaving prior to being seen by health care provider: Secondary | ICD-10-CM | POA: Diagnosis not present

## 2021-11-19 LAB — CBC WITH DIFFERENTIAL/PLATELET
Abs Immature Granulocytes: 0.07 10*3/uL (ref 0.00–0.07)
Basophils Absolute: 0.1 10*3/uL (ref 0.0–0.1)
Basophils Relative: 1 %
Eosinophils Absolute: 0.2 10*3/uL (ref 0.0–0.5)
Eosinophils Relative: 3 %
HCT: 52.1 % — ABNORMAL HIGH (ref 39.0–52.0)
Hemoglobin: 18.4 g/dL — ABNORMAL HIGH (ref 13.0–17.0)
Immature Granulocytes: 1 %
Lymphocytes Relative: 35 %
Lymphs Abs: 3.3 10*3/uL (ref 0.7–4.0)
MCH: 33.3 pg (ref 26.0–34.0)
MCHC: 35.3 g/dL (ref 30.0–36.0)
MCV: 94.2 fL (ref 80.0–100.0)
Monocytes Absolute: 0.5 10*3/uL (ref 0.1–1.0)
Monocytes Relative: 5 %
Neutro Abs: 5.3 10*3/uL (ref 1.7–7.7)
Neutrophils Relative %: 55 %
Platelets: 199 10*3/uL (ref 150–400)
RBC: 5.53 MIL/uL (ref 4.22–5.81)
RDW: 13.5 % (ref 11.5–15.5)
WBC: 9.4 10*3/uL (ref 4.0–10.5)
nRBC: 0 % (ref 0.0–0.2)

## 2021-11-19 LAB — BASIC METABOLIC PANEL
Anion gap: 10 (ref 5–15)
BUN: 6 mg/dL (ref 6–20)
CO2: 27 mmol/L (ref 22–32)
Calcium: 9.3 mg/dL (ref 8.9–10.3)
Chloride: 100 mmol/L (ref 98–111)
Creatinine, Ser: 1.11 mg/dL (ref 0.61–1.24)
GFR, Estimated: >60 mL/min (ref >60)
Glucose, Bld: 116 mg/dL — ABNORMAL HIGH (ref 70–99)
Potassium: 4.3 mmol/L (ref 3.5–5.1)
Sodium: 137 mmol/L (ref 135–145)

## 2021-11-19 LAB — TROPONIN I (HIGH SENSITIVITY): Troponin I (High Sensitivity): 6 ng/L (ref <18)

## 2021-11-19 NOTE — ED Provider Triage Note (Signed)
Emergency Medicine Provider Triage Evaluation Note  Jeffery Thompson , a 51 y.o. male  was evaluated in triage.  Pt complains of chest pain described as pressure that started about 3-1/2 hours ago.  Patient states that he has intermittently felt some discomfort throughout the week, but while he was at work sitting down, he had some stressful emails and then developed midsternal chest pain described as pressure that radiates deep into his shoulder blades.  He is nondiaphoretic and denies shortness of breath.  He denies abdominal pain, nausea, vomiting and diarrhea.  He has no previous cardiac history although he is a 1 pack/day smoker.  Denies cough, congestion.  Review of Systems  Positive:  Negative:   Physical Exam  BP 139/80 (BP Location: Right Arm)   Pulse 77   Temp 97.8 F (36.6 C) (Oral)   Resp 18   SpO2 95%  Gen:   Awake, no distress   Resp:  Normal effort  MSK:   Moves extremities without difficulty  Other:    Medical Decision Making  Medically screening exam initiated at 5:48 PM.  Appropriate orders placed.  Jeffery Thompson was informed that the remainder of the evaluation will be completed by another provider, this initial triage assessment does not replace that evaluation, and the importance of remaining in the ED until their evaluation is complete.     Janell Quiet, [place] 11/19/21 1749

## 2021-11-19 NOTE — ED Triage Notes (Signed)
Pt reports left sided chest pressure that radiates to his back, onset today around 2pm but has been having this pain on and off for 2 weeks. Denies SOBn/v/weakness.

## 2021-11-20 LAB — TROPONIN I (HIGH SENSITIVITY): Troponin I (High Sensitivity): 5 ng/L (ref <18)

## 2021-11-20 NOTE — ED Notes (Signed)
Pt decided to leave while waiting for a room.  

## 2021-11-26 ENCOUNTER — Telehealth: Payer: Self-pay

## 2021-11-26 NOTE — Telephone Encounter (Signed)
NOTES UNDER MEDIA 

## 2021-12-16 ENCOUNTER — Ambulatory Visit: Payer: Commercial Managed Care - HMO | Admitting: Cardiovascular Disease

## 2021-12-16 ENCOUNTER — Telehealth: Payer: Self-pay

## 2021-12-16 ENCOUNTER — Encounter: Payer: Self-pay | Admitting: Cardiovascular Disease

## 2021-12-16 VITALS — BP 128/80 | HR 65 | Ht 69.0 in | Wt 215.8 lb

## 2021-12-16 DIAGNOSIS — R079 Chest pain, unspecified: Secondary | ICD-10-CM

## 2021-12-16 DIAGNOSIS — D582 Other hemoglobinopathies: Secondary | ICD-10-CM | POA: Diagnosis not present

## 2021-12-16 DIAGNOSIS — R0681 Apnea, not elsewhere classified: Secondary | ICD-10-CM | POA: Diagnosis not present

## 2021-12-16 DIAGNOSIS — Z8249 Family history of ischemic heart disease and other diseases of the circulatory system: Secondary | ICD-10-CM | POA: Diagnosis not present

## 2021-12-16 MED ORDER — PREDNISONE 50 MG PO TABS: ORAL_TABLET | ORAL | 0 refills | Status: DC

## 2021-12-16 MED ORDER — DIPHENHYDRAMINE HCL 50 MG PO TABS: ORAL_TABLET | ORAL | 0 refills | Status: DC

## 2021-12-16 MED ORDER — METOPROLOL TARTRATE 50 MG PO TABS: ORAL_TABLET | ORAL | 0 refills | Status: DC

## 2021-12-16 NOTE — Progress Notes (Signed)
Cardiology Office Note:    Date:  12/16/2021   ID:  EDMUNDO Thompson, DOB Oct 10, 1970, MRN [id number]  PCP:  Eartha Inch, MD   [facility] Providers Cardiologist:  None     Referring MD: Ladora Daniel, PA-C   Chief Complaint  Patient presents with   Chest Pain     History of Present Illness:    Jeffery Thompson is a 51 y.o. male with a hx of chest pain   Developed CP  Lasted for several hours,  Off and on for a week Thinks it may have been anxiety related (had received an email that upset him)  Not related to exercise,  not related to eating or drinking.  Not related to twisting or turning of his torso. Went to the ER  Had ECG , troponin levels Waited in the ER waiting room  until 2 AM , left without being seen by a provider  Troponins were normal   Father had an MI in his 30s   Labs in the [facility] system from November 20, 2020 reveal Total cholesterol is 149 Triglyceride level is 93 HDL is 31 LDL is 100  Hb is 18.4, his spouse says he has apneic episodes frequently at night   Advised him to stop smoking  Advised him to exercise    Past Medical History:  Diagnosis Date   Chest pain    GERD (gastroesophageal reflux disease)    H/O hiatal hernia    Low testosterone     Past Surgical History:  Procedure Laterality Date   CHOLECYSTECTOMY  2011 or 2012   FOOT SURGERY  2010 or 2011   Burr removed from big toe on right foot   LAPAROSCOPIC APPENDECTOMY  07/09/2011   Procedure: APPENDECTOMY LAPAROSCOPIC;  Surgeon: Almond Lint, MD;  Location: [facility] OR;  Service: General;;    Current Medications: Current Meds  Medication Sig   diphenhydrAMINE (BENADRYL) 50 MG tablet Take 1 tablet by mouth one hour prior to scan (along with final dose of prednisone)   emtricitabine-tenofovir (TRUVADA) 200-300 MG tablet Take 1 tablet by mouth at bedtime.    escitalopram (LEXAPRO) 10 MG tablet Take 10 mg by mouth at bedtime.    hydrochlorothiazide (HYDRODIURIL) 25 MG  tablet Take 1 tablet (25 mg total) by mouth daily.   ibuprofen (ADVIL) 200 MG tablet Take 400 mg by mouth every 6 (six) hours as needed for headache.   metoprolol tartrate (LOPRESSOR) 50 MG tablet Take 1 tablet by mouth two hours prior to test   olmesartan (BENICAR) 40 MG tablet Take 40 mg by mouth daily.   omeprazole (PRILOSEC) 40 MG capsule Take 40 mg by mouth daily.   predniSONE (DELTASONE) 50 MG tablet Take 1 tablet by mouth 13 hours prior, 7 hours prior, and 1 hour prior to study   urea (CARMOL) 40 % CREA Apply topically daily. APPLY TO THICK SKIN ON FEET DAILY   Vitamin D, Ergocalciferol, (DRISDOL) 1.25 MG (50000 UNIT) CAPS capsule Take 50,000 Units by mouth once a week.   zolpidem (AMBIEN) 10 MG tablet Take 10 mg by mouth at bedtime as needed for sleep.     Allergies:   Tetracycline and Shellfish allergy   Social History   Socioeconomic History   Marital status: Married    Spouse name: Not on file   Number of children: Not on file   Years of education: Not on file   Highest education level: Not on file  Occupational History  Not on file  Tobacco Use   Smoking status: Former    Packs/day: 0.50    Years: 1.00    Total pack years: 0.50    Types: Cigarettes   Smokeless tobacco: Never  Vaping Use   Vaping Use: Never used  Substance and Sexual Activity   Alcohol use: Yes    Comment: very rarely    Drug use: No   Sexual activity: Not on file  Other Topics Concern   Not on file  Social History Narrative   Not on file   Social Determinants of Health   Financial Resource Strain: Not on file  Food Insecurity: Not on file  Transportation Needs: Not on file  Physical Activity: Not on file  Stress: Not on file  Social Connections: Not on file     Family History: The patient's family history includes Cancer in his father; Pulmonary fibrosis in his mother.  ROS:   Please see the history of present illness.     All other systems reviewed and are  negative.  EKGs/Labs/Other Studies Reviewed:    The following studies were reviewed today:   EKG: November 20, 2021: Normal sinus rhythm at 82.  Nonspecific ST and T wave changes.  No acute changes.  Recent Labs: 11/19/2021: BUN 6; Creatinine, Ser 1.11; Hemoglobin 18.4; Platelets 199; Potassium 4.3; Sodium 137  Recent Lipid Panel No results found for: "CHOL", "TRIG", "HDL", "CHOLHDL", "VLDL", "LDLCALC", "LDLDIRECT"   Risk Assessment/Calculations:      STOP-Bang Score:  6       Physical Exam:    VS:  BP 128/80   Pulse 65   Ht 5\' 9"  (1.753 m)   Wt 215 lb 12.8 oz (97.9 kg)   SpO2 98%   BMI 31.87 kg/m     Wt Readings from Last 3 Encounters:  12/16/21 215 lb 12.8 oz (97.9 kg)  11/19/21 213 lb (96.6 kg)  11/18/18 205 lb (93 kg)     GEN:  Well nourished, well developed in no acute distress HEENT: Normal NECK: No JVD; No carotid bruits LYMPHATICS: No lymphadenopathy CARDIAC: RRR, no murmurs, rubs, gallops RESPIRATORY:  Clear to auscultation without rales, wheezing or rhonchi  ABDOMEN: Soft, non-tender, non-distended MUSCULOSKELETAL:  No edema; No deformity  SKIN: Warm and dry NEUROLOGIC:  Alert and oriented x 3 PSYCHIATRIC:  Normal affect   ASSESSMENT:    1. Elevated hemoglobin (HCC)   2. Apnea   3. Chest pain of uncertain etiology   4. Family history of early CAD   5. Chest pain, unspecified type    PLAN:    In order of problems listed above:  Chest discomfort: Jeffery Thompson presents for further evaluation of an episode of chest pain.  He was evaluated in the emergency room. Acute coronary syndrome was ruled out.  He has a history of hyperlipidemia.  His lipids have improved with improvement being of his diet.  His father had a heart attack in his 92s.\\ Jeffery Thompson smokes.  He does not exercise.  I would like to proceed with a coronary CT angiogram for further evaluation.  He has an allergy to shrimp.  We will premedicate him prior to the CT scan.  I encouraged him to  stop smoking and to start a regular exercise program.  We will see him in the office in 3 months for follow-up visit.  2.  Hypertension: His blood pressure is fairly well controlled.  Continue current medications.         Medication Adjustments/Labs  and Tests Ordered: Current medicines are reviewed at length with the patient today.  Concerns regarding medicines are outlined above.  Orders Placed This Encounter  Procedures   CT CORONARY MORPH W/CTA COR W/SCORE W/CA W/CM &/OR WO/CM   [facility] Sleep Study   Meds ordered this encounter  Medications   predniSONE (DELTASONE) 50 MG tablet    Sig: Take 1 tablet by mouth 13 hours prior, 7 hours prior, and 1 hour prior to study    Dispense:  3 tablet    Refill:  0   diphenhydrAMINE (BENADRYL) 50 MG tablet    Sig: Take 1 tablet by mouth one hour prior to scan (along with final dose of prednisone)    Dispense:  1 tablet    Refill:  0   metoprolol tartrate (LOPRESSOR) 50 MG tablet    Sig: Take 1 tablet by mouth two hours prior to test    Dispense:  1 tablet    Refill:  0    Patient Instructions  Medication Instructions:  Your physician recommends that you continue on your current medications as directed. Please refer to the Current Medication list given to you today.  *If you need a refill on your cardiac medications before your next appointment, please call your pharmacy*   Lab Work: NONE If you have labs (blood work) drawn today and your tests are completely normal, you will receive your results only by: MyChart Message (if you have MyChart) OR A paper copy in the mail If you have any lab test that is abnormal or we need to change your treatment, we will call you to review the results.   Testing/Procedures: Coronary CT Angiogram Non-Cardiac CT Angiography (CTA), is a special type of CT scan that uses a computer to produce multi-dimensional views of major blood vessels throughout the body. In CT angiography, a contrast material  is injected through an IV to help visualize the blood vessels  [facility] Sleep study Your physician has recommended that you have a sleep study. This test records several body functions during sleep, including: brain activity, eye movement, oxygen and carbon dioxide blood levels, heart rate and rhythm, breathing rate and rhythm, the flow of air through your mouth and nose, snoring, body muscle movements, and chest and belly movement.  Follow-Up: At [facility], you and your health needs are our priority.  As part of our continuing mission to provide you with exceptional heart care, we have created designated Provider Care Teams.  These Care Teams include your primary Cardiologist (physician) and Advanced Practice Providers (APPs -  Physician Assistants and Nurse Practitioners) who all work together to provide you with the care you need, when you need it.   Your next appointment:   3 month(s)  The format for your next appointment:   In Person  Provider:   Arisha Gervais, Lissa Hoard, or [employer] {      Other Instructions   Your cardiac CT will be scheduled at:   [facility] [address] [place], [place] [address] [phone]  please arrive at the [facility] and Children's Entrance (Entrance C2) of [facility] 30 minutes prior to test start time. You can use the FREE valet parking offered at entrance C (encouraged to control the heart rate for the test)  Proceed to the [facility] Radiology Department (first floor) to check-in and test prep.  All radiology patients and guests should use entrance C2 at [facility], accessed from [facility], even though the hospital's  physical address listed is [address].   Please follow these instructions carefully (unless otherwise directed):  Hold all erectile dysfunction medications at least 3 days (72 hrs) prior to test.  On the Night Before the Test: Be sure to Drink plenty of water. Do not  consume any caffeinated/decaffeinated beverages or chocolate 12 hours prior to your test. Do not take any antihistamines 12 hours prior to your test. If the patient has contrast allergy: Patient will need a prescription for Prednisone and very clear instructions (as follows): Prednisone 50 mg - take 13 hours prior to test Take another Prednisone 50 mg 7 hours prior to test Take another Prednisone 50 mg 1 hour prior to test Take Benadryl 50 mg 1 hour prior to test Patient must complete all four doses of above prophylactic medications. Patient will need a ride after test due to Benadryl.  On the Day of the Test: Drink plenty of water until 1 hour prior to the test. Do not eat any food 4 hours prior to the test. You may take your regular medications prior to the test.  Take metoprolol (Lopressor) two hours prior to test.      After the Test: Drink plenty of water. After receiving IV contrast, you may experience a mild flushed feeling. This is normal. On occasion, you may experience a mild rash up to 24 hours after the test. This is not dangerous. If this occurs, you can take Benadryl 25 mg and increase your fluid intake. If you experience trouble breathing, this can be serious. If it is severe call 911 IMMEDIATELY. If it is mild, please call our office. If you take any of these medications: Glipizide/Metformin, Avandament, Glucavance, please do not take 48 hours after completing test unless otherwise instructed.  We will call to schedule your test 2-4 weeks out understanding that some insurance companies will need an authorization prior to the service being performed.   For non-scheduling related questions, please contact the cardiac imaging nurse navigator should you have any questions/concerns: Rockwell Alexandria, Cardiac Imaging Nurse Navigator Larey Brick, Cardiac Imaging Nurse Navigator [facility] Direct Office Dial: [phone]   For scheduling needs,  including cancellations and rescheduling, please call [place], [phone].   Important Information About Sugar         Signed, Kristeen Miss, MD  12/16/2021 10:39 AM    [facility]

## 2021-12-16 NOTE — Telephone Encounter (Signed)
Patient in the office today had the [facility] Sleep Study ordered. Set up app on patient's phone. Reviewed instructions. Waiver signed. Device given to patient. Stopbang completed. Patient made aware that he will be contacted once PA received from insurance.

## 2021-12-16 NOTE — Patient Instructions (Addendum)
Medication Instructions:  Your physician recommends that you continue on your current medications as directed. Please refer to the Current Medication list given to you today.  *If you need a refill on your cardiac medications before your next appointment, please call your pharmacy*   Lab Work: NONE If you have labs (blood work) drawn today and your tests are completely normal, you will receive your results only by: MyChart Message (if you have MyChart) OR A paper copy in the mail If you have any lab test that is abnormal or we need to change your treatment, we will call you to review the results.   Testing/Procedures: Coronary CT Angiogram Non-Cardiac CT Angiography (CTA), is a special type of CT scan that uses a computer to produce multi-dimensional views of major blood vessels throughout the body. In CT angiography, a contrast material is injected through an IV to help visualize the blood vessels  Itamar Sleep study Your physician has recommended that you have a sleep study. This test records several body functions during sleep, including: brain activity, eye movement, oxygen and carbon dioxide blood levels, heart rate and rhythm, breathing rate and rhythm, the flow of air through your mouth and nose, snoring, body muscle movements, and chest and belly movement.  Follow-Up: At [facility], you and your health needs are our priority.  As part of our continuing mission to provide you with exceptional heart care, we have created designated Provider Care Teams.  These Care Teams include your primary Cardiologist (physician) and Advanced Practice Providers (APPs -  Physician Assistants and Nurse Practitioners) who all work together to provide you with the care you need, when you need it.   Your next appointment:   3 month(s)  The format for your next appointment:   In Person  Provider:   Nahser, Lissa Hoard, or [employer] {      Other Instructions   Your cardiac CT will be scheduled  at:   [facility] [address] [place], [place] [address] [phone]  please arrive at the [facility] and Children's Entrance (Entrance C2) of [facility] 30 minutes prior to test start time. You can use the FREE valet parking offered at entrance C (encouraged to control the heart rate for the test)  Proceed to the [facility] Radiology Department (first floor) to check-in and test prep.  All radiology patients and guests should use entrance C2 at [facility], accessed from [facility], even though the hospital's physical address listed is [address].   Please follow these instructions carefully (unless otherwise directed):  Hold all erectile dysfunction medications at least 3 days (72 hrs) prior to test.  On the Night Before the Test: Be sure to Drink plenty of water. Do not consume any caffeinated/decaffeinated beverages or chocolate 12 hours prior to your test. Do not take any antihistamines 12 hours prior to your test. If the patient has contrast allergy: Patient will need a prescription for Prednisone and very clear instructions (as follows): Prednisone 50 mg - take 13 hours prior to test Take another Prednisone 50 mg 7 hours prior to test Take another Prednisone 50 mg 1 hour prior to test Take Benadryl 50 mg 1 hour prior to test Patient must complete all four doses of above prophylactic medications. Patient will need a ride after test due to Benadryl.  On the Day of the Test: Drink plenty of water until 1 hour prior to the test. Do not eat any food 4  hours prior to the test. You may take your regular medications prior to the test.  Take metoprolol (Lopressor) two hours prior to test.      After the Test: Drink plenty of water. After receiving IV contrast, you may experience a mild flushed feeling. This is normal. On occasion, you may experience a mild rash up to 24 hours after the test. This is not dangerous. If this  occurs, you can take Benadryl 25 mg and increase your fluid intake. If you experience trouble breathing, this can be serious. If it is severe call 911 IMMEDIATELY. If it is mild, please call our office. If you take any of these medications: Glipizide/Metformin, Avandament, Glucavance, please do not take 48 hours after completing test unless otherwise instructed.  We will call to schedule your test 2-4 weeks out understanding that some insurance companies will need an authorization prior to the service being performed.   For non-scheduling related questions, please contact the cardiac imaging nurse navigator should you have any questions/concerns: Rockwell Alexandria, Cardiac Imaging Nurse Navigator Larey Brick, Cardiac Imaging Nurse Navigator [facility] Direct Office Dial: [phone]   For scheduling needs, including cancellations and rescheduling, please call [place], [phone].   Important Information About Sugar

## 2021-12-30 NOTE — Telephone Encounter (Signed)
Prior Authorization for [facility] sent to [place] [facility] via Phone.  APPROVED-AUTH# [id number]/10  ID# [id number]-  Tedra Senegal Darrel Reach [phone] OR [employer] FOR [email or web address]

## 2022-01-01 NOTE — Telephone Encounter (Signed)
Tried to call the pt to let him know he is approved for his sleep study and to give him the PIN#. Vm is full and could not leave message with the PIN or to call back.

## 2022-01-02 ENCOUNTER — Telehealth (HOSPITAL_COMMUNITY): Payer: Self-pay | Admitting: *Deleted

## 2022-01-02 NOTE — Telephone Encounter (Signed)
Reaching out to patient to offer assistance regarding upcoming cardiac imaging study; pt verbalizes understanding of appt date/time, parking situation and where to check in, pre-test NPO status and medications ordered, and verified current allergies; name and call back number provided for further questions should they arise  Larey Brick RN Navigator Cardiac Imaging [facility] [phone] office [phone] cell  Patient to take 50mg  metoprolol tartrate two hours prior to his cardiac CT scan. He was prescribed 13 hour prep for a shellfish allergy but the patients denies a contrast allergy. He was informed that he did not have to take it since he doesn't have a contrast allergy but he may still take it as a precaution. We reviewed how to take his 13 hour prep should he decide to take it. He is aware to arrive at 2pm.

## 2022-01-02 NOTE — Telephone Encounter (Signed)
I tried to call the x 2 to give him the PIN# and ok to proceed with Itamar study. Vm I still full, could not leave a message.

## 2022-01-03 ENCOUNTER — Ambulatory Visit (HOSPITAL_COMMUNITY)
Admission: RE | Admit: 2022-01-03 | Discharge: 2022-01-03 | Disposition: A | Discharge status: Home / Self Care | Payer: Commercial Managed Care - HMO | Source: Ambulatory Visit | Attending: Cardiovascular Disease | Admitting: Cardiovascular Disease

## 2022-01-03 DIAGNOSIS — R079 Chest pain, unspecified: Secondary | ICD-10-CM | POA: Diagnosis present

## 2022-01-03 MED ORDER — NITROGLYCERIN 0.4 MG SL SUBL: 0.8000 mg | SUBLINGUAL_TABLET | Freq: Once | SUBLINGUAL | Status: DC

## 2022-01-03 MED ORDER — NITROGLYCERIN 0.4 MG SL SUBL
SUBLINGUAL_TABLET | SUBLINGUAL | Status: AC
  Filled 2022-01-03: qty 2

## 2022-01-03 MED ORDER — IOHEXOL 350 MG/ML SOLN
100.0000 mL | Freq: Once | INTRAVENOUS | Status: AC | PRN
  Administered 2022-01-03: 100 mL via INTRAVENOUS

## 2022-01-06 NOTE — Telephone Encounter (Signed)
Pt has been given PIN # [id number] and ok to proceed with Itamar study.. pt states he will sleep study this week. Called and made the patient aware that he may proceed with the [facility] Sleep Study. PIN # provided to the patient. Patient made aware that he will be contacted after the test has been read with the results and any recommendations. Patient verbalized understanding and thanked me for the call.

## 2022-01-06 NOTE — Telephone Encounter (Signed)
Pt is calling back in regards to [facility] study and is requesting call back.

## 2022-01-07 ENCOUNTER — Encounter (HOSPITAL_BASED_OUTPATIENT_CLINIC_OR_DEPARTMENT_OTHER): Payer: Commercial Managed Care - HMO | Admitting: Cardiology

## 2022-01-07 DIAGNOSIS — G4733 Obstructive sleep apnea (adult) (pediatric): Secondary | ICD-10-CM

## 2022-01-08 ENCOUNTER — Ambulatory Visit: Payer: Commercial Managed Care - HMO | Attending: Cardiovascular Disease

## 2022-01-08 ENCOUNTER — Telehealth: Payer: Self-pay | Admitting: *Deleted

## 2022-01-08 DIAGNOSIS — R0681 Apnea, not elsewhere classified: Secondary | ICD-10-CM

## 2022-01-08 DIAGNOSIS — D582 Other hemoglobinopathies: Secondary | ICD-10-CM

## 2022-01-08 NOTE — Telephone Encounter (Signed)
-----   Message ----- From: Quintella Reichert MD  Sent: 01/08/2022  11:14 AM EDT  To: Cv Div Sleep Studies   Minimal OSA with AHI 5/hr.  Please have patient do a Stop Bang score and send to me.  Set up virtual visit to discuss treatment options

## 2022-01-08 NOTE — Telephone Encounter (Addendum)
STOP BANG RISK ASSESSMENT S (snore) Have you been told that you snore?     YES   T (tired) Are you often tired, fatigued, or sleepy during the day?   NO  O (obstruction) Do you stop breathing, choke, or gasp during sleep? NO   P (pressure) Do you have or are you being treated for high blood pressure? YES   B (BMI) Is your body index greater than 35 kg/m? NO   A (age) Are you 50 years old or older? YES   N (neck) Do you have a neck circumference greater than 16 inches?   NO   G (gender) Are you a male? YES   TOTAL STOP/BANG "YES" ANSWERS 4                                                                       For Office Use Only              Procedure Order Form    YES to 3+ Stop Bang questions OR two clinical symptoms - patient qualifies for WatchPAT (CPT [phone])             Patient Signature ______________________________________________________   Date______________________ Patient Telemedicine Verbal Consent

## 2022-01-08 NOTE — Procedures (Signed)
   SLEEP STUDY REPORT Patient Information Study Date: 01/07/22 Patient Name: Jeffery Thompson Patient ID: [id number] Birth Date: 06-22-2070 Age: 51 Gender: Male BMI: 32.0 (W=216 lb, H=5' 9'') Referring Physician: Kristeen Miss, MD  TEST DESCRIPTION: Home sleep apnea testing was completed using the WatchPat, a Type 1 device, utilizing  peripheral arterial tonometry (PAT), chest movement, actigraphy, pulse oximetry, pulse rate, body position and snore.  AHI was calculated with apnea and hypopnea using valid sleep time as the denominator. RDI includes apneas,  hypopneas, and RERAs. The data acquired and the scoring of sleep and all associated events were performed in  accordance with the recommended standards and specifications as outlined in the AASM Manual for the Scoring of  Sleep and Associated Events 2.2.0 (2015).   FINDINGS:  1. Mild Obstructive Sleep Apnea with AHI 5/hr.  2. No Central Sleep Apnea with pAHIc 0.5/hr.  3. Oxygen desaturations as low as 84%.  4. Mild snoring was present. O2 sats were < 88% for 1.1 min.  5. Total sleep time was 6 hrs and 3 min.  6. 18.9% of total sleep time was spent in REM sleep.  7. Shortened sleep onset latency at 6 min.  8. Prolonged REM sleep onset latency at 228 min.  9. Total awakenings were 7.   DIAGNOSIS: Mild Obstructive Sleep Apnea (G47.33)  RECOMMENDATIONS: 1. Clinical correlation of these findings is necessary. The decision to treat obstructive sleep apnea (OSA) is usually  based on the presence of apnea symptoms or the presence of associated medical conditions such as Hypertension,  Congestive Heart Failure, Atrial Fibrillation or Obesity. The most common symptoms of OSA are snoring, gasping for  breath while sleeping, daytime sleepiness and fatigue.   2. Initiating apnea therapy is recommended given the presence of symptoms and/or associated conditions.  Recommend proceeding with one of the following:   a. Auto-CPAP therapy with  a pressure range of 5-20cm H2O.   b. An oral appliance (OA) that can be obtained from certain dentists with expertise in sleep medicine. These are  primarily of use in non-obese patients with mild and moderate disease.   c. An ENT consultation which may be useful to look for specific causes of obstruction and possible treatment  options.  3. Close follow-up is necessary to ensure success with CPAP or oral appliance therapy for maximum benefit .  4. A follow-up oximetry study on CPAP is recommended to assess the adequacy of therapy and determine the need  for supplemental oxygen or the potential need for Bi-level therapy. An arterial blood gas to determine the adequacy of  baseline ventilation and oxygenation should also be considered.  5. Healthy sleep recommendations include: adequate nightly sleep (normal 7-9 hrs/night), avoidance of caffeine after  noon and alcohol near bedtime, and maintaining a sleep environment that is cool, dark and quiet.  6. Weight loss for overweight patients is recommended. Even modest amounts of weight loss can significantly  improve the severity of sleep apnea.  7. Snoring recommendations include: weight loss where appropriate, side sleeping, and avoidance of alcohol before  bed.  8. Operation of motor vehicle should be avoided when sleepy.  Signature: Armanda Magic, MD; [facility]; Diplomat, American Board of Sleep  Medicine Electronically Signed: 01/08/22

## 2022-01-17 ENCOUNTER — Telehealth: Payer: Self-pay

## 2022-01-17 DIAGNOSIS — Q245 Malformation of coronary vessels: Secondary | ICD-10-CM

## 2022-01-17 DIAGNOSIS — Z01810 Encounter for preprocedural cardiovascular examination: Secondary | ICD-10-CM

## 2022-01-17 NOTE — Telephone Encounter (Signed)
Order placed for Cardiac stress MRI. Verified order with Larey Brick and need for CBC. Called patient and he is scheduled for lab on Tuesday 01/21/22. He questions cost of the MRI and states he will be paying out-of-pocket for him. Advised I wasn't sure, but I would ask the schedulers to see if we can get him a quote. Routing to [place], [place], and [place] for any help with that.

## 2022-01-17 NOTE — Telephone Encounter (Signed)
-----   Message from Vesta Mixer, MD sent at 01/06/2022  5:15 PM EDT ----- I have discussed with Dr. Bjorn Pippin.   He recommends a stress ( Lexiscan) cardiac MRI.  These are performed at the hospital , supervised by our APPs . 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

## 2022-01-28 ENCOUNTER — Ambulatory Visit: Payer: Commercial Managed Care - HMO | Attending: Cardiovascular Disease

## 2022-01-28 DIAGNOSIS — Z01810 Encounter for preprocedural cardiovascular examination: Secondary | ICD-10-CM

## 2022-01-28 LAB — CBC
Hematocrit: 51 % (ref 37.5–51.0)
Hemoglobin: 17.5 g/dL (ref 13.0–17.7)
MCH: 32.5 pg (ref 26.6–33.0)
MCHC: 34.3 g/dL (ref 31.5–35.7)
MCV: 95 fL (ref 79–97)
Platelets: 163 10*3/uL (ref 150–450)
RBC: 5.38 x10E6/uL (ref 4.14–5.80)
RDW: 13.7 % (ref 11.6–15.4)
WBC: 6.9 10*3/uL (ref 3.4–10.8)

## 2022-03-16 NOTE — Progress Notes (Signed)
This encounter was created in error - please disregard.

## 2022-03-17 ENCOUNTER — Encounter: Payer: Commercial Managed Care - HMO | Admitting: Cardiovascular Disease

## 2022-03-18 ENCOUNTER — Ambulatory Visit: Payer: Commercial Managed Care - HMO | Attending: Cardiology | Admitting: Cardiology

## 2022-03-18 ENCOUNTER — Encounter: Payer: Self-pay | Admitting: Cardiology

## 2022-03-18 VITALS — Ht 69.0 in | Wt 206.0 lb

## 2022-03-18 DIAGNOSIS — G4733 Obstructive sleep apnea (adult) (pediatric): Secondary | ICD-10-CM

## 2022-03-18 NOTE — Progress Notes (Signed)
Erroneous encounter This encounter was created in error - please disregard. 

## 2022-04-04 ENCOUNTER — Other Ambulatory Visit (HOSPITAL_COMMUNITY): Payer: Self-pay | Admitting: *Deleted

## 2022-04-04 DIAGNOSIS — Z01810 Encounter for preprocedural cardiovascular examination: Secondary | ICD-10-CM

## 2022-04-09 ENCOUNTER — Telehealth (HOSPITAL_COMMUNITY): Payer: Self-pay | Admitting: Emergency Medicine

## 2022-04-09 NOTE — Telephone Encounter (Signed)
Attempted to call patient regarding upcoming cardiac MR appointment. Left message on voicemail with name and callback number Emalee Knies RN Navigator Cardiac Imaging [facility] [phone] Office [phone] Cell  

## 2022-04-09 NOTE — Telephone Encounter (Signed)
Attempted to call patient regarding upcoming cardiac CT appointment. Left message on voicemail with name and callback number Rockwell Alexandria RN Navigator Cardiac Imaging [facility] [phone] Office [phone] Cell  Detailed message with instructions left on vm Also sent mychart message

## 2022-04-10 ENCOUNTER — Encounter (HOSPITAL_COMMUNITY): Payer: Self-pay

## 2022-04-10 ENCOUNTER — Other Ambulatory Visit: Payer: Self-pay | Admitting: Cardiovascular Disease

## 2022-04-10 ENCOUNTER — Ambulatory Visit (HOSPITAL_COMMUNITY)
Admission: RE | Admit: 2022-04-10 | Discharge: 2022-04-10 | Disposition: A | Discharge status: Home / Self Care | Payer: Commercial Managed Care - HMO | Source: Ambulatory Visit | Attending: Cardiovascular Disease | Admitting: Cardiovascular Disease

## 2022-04-10 ENCOUNTER — Encounter: Payer: Self-pay | Admitting: Cardiology

## 2022-04-10 DIAGNOSIS — Q245 Malformation of coronary vessels: Secondary | ICD-10-CM | POA: Insufficient documentation

## 2022-04-10 DIAGNOSIS — Z01810 Encounter for preprocedural cardiovascular examination: Secondary | ICD-10-CM | POA: Diagnosis present

## 2022-04-10 MED ORDER — REGADENOSON 0.4 MG/5ML IV SOLN
INTRAVENOUS | Status: AC
  Administered 2022-04-10: 0.4 mg
  Filled 2022-04-10: qty 5

## 2022-04-10 MED ORDER — GADOBUTROL 1 MMOL/ML IV SOLN
10.0000 mL | Freq: Once | INTRAVENOUS | Status: AC | PRN
Start: 2022-04-10 — End: 2022-04-10
  Administered 2022-04-10: 10 mL via INTRAVENOUS

## 2022-04-10 NOTE — Progress Notes (Signed)
Patient presents for stress MRI.  BP 112/73 , HR 53.  No caffeine intake in prior 12 hours. No wheezing on exam.  EKG today shows sinus bradycardia, rate 53.   Shared Decision Making/Informed Consent The risks [chest pain, shortness of breath, cardiac arrhythmias, dizziness, blood pressure fluctuations, myocardial infarction, stroke/transient ischemic attack, nausea, vomiting, allergic reaction, and life-threatening complications (estimated to be 1 in 10,000)], benefits (risk stratification, diagnosing coronary artery disease, treatment guidance) and alternatives of a MRI stress test were discussed in detail with patient and they agree to proceed.

## 2022-04-27 NOTE — Progress Notes (Unsigned)
Cardiology Office Note:    Date:  04/28/2022   ID:  Jeffery Thompson, DOB 02/27/1971, MRN [id number]  PCP:  Eartha Inch, MD   [facility] Providers Cardiologist:  Devoiry Corriher   Sleep Medicine:  Armanda Magic, MD     Referring MD: Eartha Inch, MD   Chief Complaint  Patient presents with   Sleep Apnea     History of Present Illness:    Jeffery Thompson is a 51 y.o. male with a hx of chest pain   Developed CP  Lasted for several hours,  Off and on for a week Thinks it may have been anxiety related (had received an email that upset him)  Not related to exercise,  not related to eating or drinking.  Not related to twisting or turning of his torso. Went to the ER  Had ECG , troponin levels Waited in the ER waiting room  until 2 AM , left without being seen by a provider  Troponins were normal   Father had an MI in his 30s   Labs in the [place] system from November 20, 2020 reveal Total cholesterol is 149 Triglyceride level is 93 HDL is 31 LDL is 100  Hb is 18.4, his spouse says he has apneic episodes frequently at night   Advised him to stop smoking  Advised him to exercise    Nov. 6, 2023 Jeffery Thompson is seen today for follow up of his chest pain   Was seen several months ago for chest pain  Coronary CTA showed  Coronary calcium score of 17. This was 72nd percentile for age and sex matched control. RCA - small , nondominant, normal LM - normal LAD - mild proximal disease  LCx - large, dominant, no disease   Dec. 18, 2023  Jeffery Thompson is see for follow up of his coronary artery calcification and osa  Coronary CTA showed  Coronary calcium score of 17. This was 72nd percentile for age and sex matched control. 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 Bjorn Pippin suggested a cardiac MRI for further eval  Cardiac stress  MRI-   no evidence of ischemia .  No significant shunt was found .  No late gadolinium enhancement to suggest previous MI / scar   Wt is 210 lbs  Has los 7 lbs in the past month Still smoking   The lipid levels from Dr. Fortunato Curling office at [facility] from November 20, 2020 reveal Total cholesterol of 149 Triglyceride level is 93 HDL is 31 LDL is 100    Past Medical History:  Diagnosis Date   Chest pain    GERD (gastroesophageal reflux disease)    H/O hiatal hernia    Low testosterone     Past Surgical History:  Procedure Laterality Date   CHOLECYSTECTOMY  2011 or 2012   FOOT SURGERY  2010 or 2011   Burr removed from big toe on right foot   LAPAROSCOPIC APPENDECTOMY  07/09/2011   Procedure: APPENDECTOMY LAPAROSCOPIC;  Surgeon: Almond Lint, MD;  Location: [facility] OR;  Service: General;;    Current Medications: Current Meds  Medication Sig   emtricitabine-tenofovir (TRUVADA) 200-300 MG tablet Take 1 tablet by mouth at bedtime.    escitalopram (LEXAPRO) 10 MG tablet Take 10 mg by mouth at bedtime.    olmesartan (BENICAR) 40 MG tablet Take 40  mg by mouth daily.   omeprazole (PRILOSEC) 40 MG capsule Take 40 mg by mouth daily.   urea (CARMOL) 40 % CREA Apply topically daily. APPLY TO THICK SKIN ON FEET DAILY   Vitamin D, Ergocalciferol, (DRISDOL) 1.25 MG (50000 UNIT) CAPS capsule Take 1 capsule by mouth once a week.   zolpidem (AMBIEN) 10 MG tablet Take 10 mg by mouth at bedtime as needed for sleep.     Allergies:   Tetracycline and Shellfish allergy   Social History   Socioeconomic History   Marital status: Married    Spouse name: Not on file   Number of children: Not on file   Years of education: Not on file   Highest education level: Not on file  Occupational History   Not on file  Tobacco Use   Smoking status: Every Day    Packs/day: 0.50    Years: 1.00    Total pack years: 0.50    Types: Cigarettes   Smokeless tobacco: Never  Vaping Use   Vaping Use: Never used   Substance and Sexual Activity   Alcohol use: Yes    Comment: very rarely    Drug use: No   Sexual activity: Not on file  Other Topics Concern   Not on file  Social History Narrative   Not on file   Social Determinants of Health   Financial Resource Strain: Not on file  Food Insecurity: Not on file  Transportation Needs: Not on file  Physical Activity: Not on file  Stress: Not on file  Social Connections: Not on file     Family History: The patient's family history includes Cancer in his father; Pulmonary fibrosis in his mother.  ROS:   Please see the history of present illness.     All other systems reviewed and are negative.  EKGs/Labs/Other Studies Reviewed:    The following studies were reviewed today:   EKG:   Recent Labs: 11/19/2021: BUN 6; Creatinine, Ser 1.11; Potassium 4.3; Sodium 137 01/28/2022: Hemoglobin 17.5; Platelets 163  Recent Lipid Panel No results found for: "CHOL", "TRIG", "HDL", "CHOLHDL", "VLDL", "LDLCALC", "LDLDIRECT"   Risk Assessment/Calculations:      STOP-Bang Score:  6       Physical Exam:     Physical Exam: Blood pressure 135/78, pulse 60, height 5\' 9"  (1.753 m), weight 210 lb (95.3 kg), SpO2 97 %.       GEN:  Well nourished, well developed in no acute distress HEENT: Normal NECK: No JVD; No carotid bruits LYMPHATICS: No lymphadenopathy CARDIAC: RRR , no murmurs, rubs, gallops RESPIRATORY:  Clear to auscultation without rales, wheezing or rhonchi  ABDOMEN: Soft, non-tender, non-distended MUSCULOSKELETAL:  No edema; No deformity  SKIN: Warm and dry NEUROLOGIC:  Alert and oriented x 3    ASSESSMENT:    1. Mixed hyperlipidemia   2. Primary hypertension     PLAN:       Chest discomfort: He does not have coronary artery disease by coronary CT angiogram.   2.  Hypertension: Blood pressure is fairly well-controlled.  He still needs to work on weight loss.  He needs to work on smoking cessation.  Continue current  medications.  3. CAC  :  Coronary calcium score of 17. This was 72nd percentile for age and sex matched control.  his LDL should be < 70  He would like to follow-up with Dr. for further management of his lipids.  4.  Obstructive sleep apnea.  He will continue  to follow-up with Dr. Mayford Knife.   Medication Adjustments/Labs and Tests Ordered: Current medicines are reviewed at length with the patient today.  Concerns regarding medicines are outlined above.  No orders of the defined types were placed in this encounter.  No orders of the defined types were placed in this encounter.   Patient Instructions  Medication Instructions:  Your physician recommends that you continue on your current medications as directed. Please refer to the Current Medication list given to you today.  *If you need a refill on your cardiac medications before your next appointment, please call your pharmacy*   Lab Work: NONE If you have labs (blood work) drawn today and your tests are completely normal, you will receive your results only by: MyChart Message (if you have MyChart) OR A paper copy in the mail If you have any lab test that is abnormal or we need to change your treatment, we will call you to review the results.   Testing/Procedures: NONE   Follow-Up: At [facility], you and your health needs are our priority.  As part of our continuing mission to provide you with exceptional heart care, we have created designated Provider Care Teams.  These Care Teams include your primary Cardiologist (physician) and Advanced Practice Providers (APPs -  Physician Assistants and Nurse Practitioners) who all work together to provide you with the care you need, when you need it.  We recommend signing up for the patient portal called "MyChart".  Sign up information is provided on this After Visit Summary.  MyChart is used to connect with patients for Virtual Visits (Telemedicine).  Patients are able to  view lab/test results, encounter notes, upcoming appointments, etc.  Non-urgent messages can be sent to your provider as well.   To learn more about what you can do with MyChart, go to [email or web address].    Your next appointment:   As Needed  The format for your next appointment:   In Person  Provider:   Kristeen Miss, MD    Important Information About Sugar         Signed, Kristeen Miss, MD  04/28/2022 6:08 PM    [facility]

## 2022-04-28 ENCOUNTER — Ambulatory Visit: Payer: Commercial Managed Care - HMO | Attending: Cardiovascular Disease | Admitting: Cardiovascular Disease

## 2022-04-28 ENCOUNTER — Encounter: Payer: Self-pay | Admitting: Cardiovascular Disease

## 2022-04-28 VITALS — BP 135/78 | HR 60 | Ht 69.0 in | Wt 210.0 lb

## 2022-04-28 DIAGNOSIS — I10 Essential (primary) hypertension: Secondary | ICD-10-CM | POA: Diagnosis not present

## 2022-04-28 DIAGNOSIS — E782 Mixed hyperlipidemia: Secondary | ICD-10-CM

## 2022-04-28 NOTE — Patient Instructions (Signed)
Medication Instructions:  Your physician recommends that you continue on your current medications as directed. Please refer to the Current Medication list given to you today.  *If you need a refill on your cardiac medications before your next appointment, please call your pharmacy*   Lab Work: NONE If you have labs (blood work) drawn today and your tests are completely normal, you will receive your results only by: MyChart Message (if you have MyChart) OR A paper copy in the mail If you have any lab test that is abnormal or we need to change your treatment, we will call you to review the results.   Testing/Procedures: NONE   Follow-Up: At [facility], you and your health needs are our priority.  As part of our continuing mission to provide you with exceptional heart care, we have created designated Provider Care Teams.  These Care Teams include your primary Cardiologist (physician) and Advanced Practice Providers (APPs -  Physician Assistants and Nurse Practitioners) who all work together to provide you with the care you need, when you need it.  We recommend signing up for the patient portal called "MyChart".  Sign up information is provided on this After Visit Summary.  MyChart is used to connect with patients for Virtual Visits (Telemedicine).  Patients are able to view lab/test results, encounter notes, upcoming appointments, etc.  Non-urgent messages can be sent to your provider as well.   To learn more about what you can do with MyChart, go to [email or web address].    Your next appointment:   As Needed  The format for your next appointment:   In Person  Provider:   Philip Nahser, MD       Important Information About Sugar       

## 2023-02-10 ENCOUNTER — Other Ambulatory Visit: Payer: Self-pay | Admitting: Family Medicine

## 2023-02-10 DIAGNOSIS — R748 Abnormal levels of other serum enzymes: Secondary | ICD-10-CM

## 2023-02-16 ENCOUNTER — Ambulatory Visit
Admission: RE | Admit: 2023-02-16 | Discharge: 2023-02-16 | Disposition: A | Discharge status: Home / Self Care | Payer: Managed Care, Other (non HMO) | Source: Ambulatory Visit | Attending: Family Medicine | Admitting: Family Medicine

## 2023-02-16 DIAGNOSIS — R748 Abnormal levels of other serum enzymes: Secondary | ICD-10-CM

## 2023-04-29 ENCOUNTER — Ambulatory Visit (HOSPITAL_COMMUNITY)
Admission: EM | Admit: 2023-04-29 | Discharge: 2023-04-29 | Disposition: A | Discharge status: Home / Self Care | Payer: Commercial Managed Care - HMO | Attending: Family Medicine | Admitting: Family Medicine

## 2023-04-29 ENCOUNTER — Ambulatory Visit (INDEPENDENT_AMBULATORY_CARE_PROVIDER_SITE_OTHER): Payer: Commercial Managed Care - HMO

## 2023-04-29 ENCOUNTER — Other Ambulatory Visit: Payer: Self-pay

## 2023-04-29 ENCOUNTER — Encounter (HOSPITAL_COMMUNITY): Payer: Self-pay | Admitting: *Deleted

## 2023-04-29 DIAGNOSIS — R0781 Pleurodynia: Secondary | ICD-10-CM | POA: Diagnosis not present

## 2023-04-29 LAB — POCT URINALYSIS DIP (MANUAL ENTRY)
Bilirubin, UA: NEGATIVE
Blood, UA: NEGATIVE
Glucose, UA: NEGATIVE mg/dL
Ketones, POC UA: NEGATIVE mg/dL
Leukocytes, UA: NEGATIVE
Nitrite, UA: NEGATIVE
Protein Ur, POC: NEGATIVE mg/dL
Spec Grav, UA: 1.01 (ref 1.010–1.025)
Urobilinogen, UA: 1 U/dL
pH, UA: 7 (ref 5.0–8.0)

## 2023-04-29 MED ORDER — KETOROLAC TROMETHAMINE 60 MG/2ML IM SOLN
60.0000 mg | Freq: Once | INTRAMUSCULAR | Status: AC
  Administered 2023-04-29: 60 mg via INTRAMUSCULAR

## 2023-04-29 MED ORDER — PREDNISONE 50 MG PO TABS: ORAL_TABLET | ORAL | 0 refills | Status: DC

## 2023-04-29 MED ORDER — KETOROLAC TROMETHAMINE 30 MG/ML IJ SOLN
INTRAMUSCULAR | Status: AC
Start: 2023-04-29 — End: ?
  Filled 2023-04-29: qty 1

## 2023-04-29 MED ORDER — KETOROLAC TROMETHAMINE 60 MG/2ML IM SOLN
INTRAMUSCULAR | Status: AC
  Filled 2023-04-29: qty 2

## 2023-04-29 MED ORDER — CYCLOBENZAPRINE HCL 10 MG PO TABS: ORAL_TABLET | ORAL | 0 refills | Status: DC

## 2023-04-29 NOTE — ED Triage Notes (Signed)
Pt reports Lt sided ABD pain started after he got home yesterday . Pt points to Lt upper ABD. Pt reports he had the same type of pain in Nov. Pt reports pain improved in Nov and did not seek medical care.

## 2023-04-29 NOTE — ED Provider Notes (Signed)
[facility]   [id number] 04/29/23 Arrival Time: 1130  ASSESSMENT & PLAN:  1. Rib pain on left side    Unclear cause of L-sided rib pain. Discussed. Do not suspect cardiac etiology. U/A normal.  I have personally viewed and independently interpreted the imaging studies ordered this visit. CXR: no acute changes; no pneumothorax.  He elects trial of below: Meds ordered this encounter  Medications   ketorolac (TORADOL) injection 60 mg   predniSONE (DELTASONE) 50 MG tablet    Sig: Take one tablet by mouth for 5 days.    Dispense:  5 tablet    Refill:  0   cyclobenzaprine (FLEXERIL) 10 MG tablet    Sig: Take 1 tablet by mouth 3 times daily as needed for muscle spasm. Warning: May cause drowsiness.    Dispense:  21 tablet    Refill:  0    Follow-up Information     [facility] [place] [facility].   Specialty: Emergency Medicine Why: If symptoms worsen in any way or fail to improve wtih the medications given today. Contact information: [address] [place] [address] [phone]               Reviewed expectations re: course of current medical issues. Questions answered. Outlined signs and symptoms indicating need for more acute intervention. Patient verbalized understanding. After Visit Summary given.   SUBJECTIVE:  History from: patient. Jeffery Thompson is a 52 y.o. male who presents with complaint of L-sided chest pain; over lower L ribs/side; noted yesterday; worse today; worse with basically any movements. Denies SOB/n/v/diaphoresis. OTC analgesics without relief. Denies fever/recent illnesses. Denies rash.   Social History   Tobacco Use  Smoking Status Every Day   Current packs/day: 0.50   Average packs/day: 0.5 packs/day for 1 year (0.5 ttl pk-yrs)   Types: Cigarettes  Smokeless Tobacco Never   Social History   Substance and Sexual Activity  Alcohol Use Yes   Comment: very rarely       OBJECTIVE:  Vitals:   04/29/23 1149  BP: (!) 157/88  Pulse: 64  Resp: 20  Temp: 97.6 F (36.4 C)  SpO2: 94%    General appearance: alert, oriented, no acute distress Eyes: PERRLA; EOMI; conjunctivae normal HENT: normocephalic; atraumatic Neck: supple with FROM Lungs: without labored respirations; speaks full sentences without difficulty; CTAB Heart: regular rate and rhythm without murmer Chest Wall: with tenderness to palpation over lower LEFT ribs/side; without overlying skin changes Abdomen: soft, non-tender; no guarding or rebound tenderness Extremities: without edema; without calf swelling or tenderness; symmetrical without gross deformities Skin: warm and dry; without rash or lesions Neuro: normal gait Psychological: alert and cooperative; normal mood and affect  Labs: Results for orders placed or performed during the hospital encounter of 04/29/23  POC urinalysis dipstick   Collection Time: 04/29/23 12:28 PM  Result Value Ref Range   Color, UA yellow yellow   Clarity, UA clear clear   Glucose, UA negative negative mg/dL   Bilirubin, UA negative negative   Ketones, POC UA negative negative mg/dL   Spec Grav, UA 1.324 4.010 - 1.025   Blood, UA negative negative   pH, UA 7.0 5.0 - 8.0   Protein Ur, POC negative negative mg/dL   Urobilinogen, UA 1.0 0.2 or 1.0 E.U./dL   Nitrite, UA Negative Negative   Leukocytes, UA Negative Negative   Labs Reviewed  POCT URINALYSIS DIP (MANUAL ENTRY)    Imaging: DG Chest 2  View Result Date: 04/29/2023 CLINICAL DATA:  Left-sided chest pain since yesterday, left upper abdominal pain EXAM: CHEST - 2 VIEW COMPARISON:  11/19/2021 FINDINGS: Frontal and lateral views of the chest demonstrate an unremarkable cardiac silhouette. No airspace disease, effusion, or pneumothorax. No acute bony abnormalities. IMPRESSION: 1. No acute intrathoracic process. Electronically Signed   By: Sharlet Salina M.D.   On: 04/29/2023 14:29      Allergies  Allergen Reactions   Tetracycline Rash   Shellfish Allergy Other (See Comments)    "Whelps all over"    Past Medical History:  Diagnosis Date   Chest pain    GERD (gastroesophageal reflux disease)    H/O hiatal hernia    Low testosterone    Social History   Socioeconomic History   Marital status: Married    Spouse name: Not on file   Number of children: Not on file   Years of education: Not on file   Highest education level: Not on file  Occupational History   Not on file  Tobacco Use   Smoking status: Every Day    Current packs/day: 0.50    Average packs/day: 0.5 packs/day for 1 year (0.5 ttl pk-yrs)    Types: Cigarettes   Smokeless tobacco: Never  Vaping Use   Vaping status: Never Used  Substance and Sexual Activity   Alcohol use: Yes    Comment: very rarely    Drug use: No   Sexual activity: Not on file  Other Topics Concern   Not on file  Social History Narrative   Not on file   Social Drivers of Health   Financial Resource Strain: Low Risk  (03/31/2021)   Received from [facility], [facility]   Overall Financial Resource Strain (CARDIA)    Difficulty of Paying Living Expenses: Not hard at all  Food Insecurity: No Food Insecurity (11/22/2021)   Received from [facility], [facility]   Hunger Vital Sign    Worried About Running Out of Food in the Last Year: Never true    Ran Out of Food in the Last Year: Never true  Transportation Needs: No Transportation Needs (03/31/2021)   Received from [facility], [facility]   PRAPARE - Transportation    Lack of Transportation (Medical): No    Lack of Transportation (Non-Medical): No  Physical Activity: Inactive (03/31/2021)   Received from [facility], [facility]   Exercise Vital Sign    Days of Exercise per Week: 0 days    Minutes of Exercise per Session: 0 min  Stress: Stress Concern Present (03/31/2021)   Received from [facility], [facility]  Occupational Health - Occupational Stress Questionnaire    Feeling of Stress : Rather much  Social Connections: Unknown (09/18/2021)   Received from [facility], [facility]   Social Network    Social Network: Not on file  Intimate Partner Violence: Unknown (08/12/2021)   Received from [facility], [facility]   HITS    Physically Hurt: Not on file    Insult or Talk Down To: Not on file    Threaten Physical Harm: Not on file    Scream or Curse: Not on file   Family History  Problem Relation Age of Onset   Pulmonary fibrosis Mother    Cancer Father        pancreatic   Past Surgical History:  Procedure Laterality Date   CHOLECYSTECTOMY  2011 or 2012   FOOT SURGERY  2010  or 2011   Burr removed from big toe on right foot   LAPAROSCOPIC APPENDECTOMY  07/09/2011   Procedure: APPENDECTOMY LAPAROSCOPIC;  Surgeon: Almond Lint, MD;  Location: [facility] OR;  Service: Leone Brand, MD 04/29/23 1745

## 2023-07-24 ENCOUNTER — Ambulatory Visit (HOSPITAL_COMMUNITY)
Admission: EM | Admit: 2023-07-24 | Discharge: 2023-07-24 | Disposition: A | Discharge status: Home / Self Care | Attending: Family Medicine | Admitting: Family Medicine

## 2023-07-24 ENCOUNTER — Encounter (HOSPITAL_COMMUNITY): Payer: Self-pay

## 2023-07-24 DIAGNOSIS — L03311 Cellulitis of abdominal wall: Secondary | ICD-10-CM | POA: Diagnosis not present

## 2023-07-24 MED ORDER — SULFAMETHOXAZOLE-TRIMETHOPRIM 800-160 MG PO TABS: 1.0000 | ORAL_TABLET | Freq: Two times a day (BID) | ORAL | 0 refills | Status: AC

## 2023-07-24 NOTE — ED Triage Notes (Signed)
 Patient reports that he has an abscess to the left groin and mid lower abdomen x 2 weeks.  Patient  states he was using a gel nasally, but the last 2 times that he used it he states he felt like he had the flu and a sinus headache.

## 2023-07-24 NOTE — Discharge Instructions (Signed)
 Take sulfamethoxazole-trimethoprim 800-160 mg--1 tablet 2 times daily for 7 days.  Warm compresses to the area.  Please follow-up with your primary care about this issue

## 2023-07-24 NOTE — ED Provider Notes (Signed)
 [facility]    CSN: [id number] Arrival date & time: 07/24/23  3086      History   Chief Complaint Chief Complaint  Patient presents with   Abscess    HPI Jeffery Thompson is a 53 y.o. male.    Abscess Here for redness and swelling on his lower abdomen that's been there for about 2 weeks. No f/c.  He had understood from a prior PCP that he should use an antibiotic gel (?mupirocin) in his nose BID x 3 days everytime he had similar trouble. Now that is making him have h/a and congestion when he uses it.  Allergic to TCN-rash  Past Medical History:  Diagnosis Date   Chest pain    GERD (gastroesophageal reflux disease)    H/O hiatal hernia    Low testosterone     Patient Active Problem List   Diagnosis Date Noted   Apnea 12/16/2021   Asthma 07/22/2007   DYSPNEA 07/22/2007    Past Surgical History:  Procedure Laterality Date   CHOLECYSTECTOMY  2011 or 2012   FOOT SURGERY  2010 or 2011   Burr removed from big toe on right foot   LAPAROSCOPIC APPENDECTOMY  07/09/2011   Procedure: APPENDECTOMY LAPAROSCOPIC;  Surgeon: Almond Lint, MD;  Location: [facility] OR;  Service: General;;       Home Medications    Prior to Admission medications   Medication Sig Start Date End Date Taking? Authorizing Provider  sulfamethoxazole-trimethoprim (BACTRIM DS) 800-160 MG tablet Take 1 tablet by mouth 2 (two) times daily for 7 days. 07/24/23 07/31/23 Yes Zenia Resides, MD  emtricitabine-tenofovir (TRUVADA) 200-300 MG tablet Take 1 tablet by mouth at bedtime.     [provider]  escitalopram (LEXAPRO) 10 MG tablet Take 10 mg by mouth at bedtime.     [provider]  olmesartan (BENICAR) 40 MG tablet Take 40 mg by mouth daily. 11/17/21   [provider]  omeprazole (PRILOSEC) 40 MG capsule Take 40 mg by mouth daily. 10/19/18   [provider]  zolpidem (AMBIEN) 10 MG tablet Take 10 mg by mouth at bedtime as needed for sleep.    [provider]    Family History Family History  Problem Relation Age of Onset   Pulmonary fibrosis Mother    Cancer Father        pancreatic    Social History Social History   Tobacco Use   Smoking status: Every Day    Current packs/day: 0.50    Average packs/day: 0.5 packs/day for 1 year (0.5 ttl pk-yrs)    Types: Cigarettes   Smokeless tobacco: Never  Vaping Use   Vaping status: Never Used  Substance Use Topics   Alcohol use: Yes    Comment: very rarely    Drug use: No     Allergies   Tetracycline and Shellfish allergy   Review of Systems Review of Systems   Physical Exam Triage Vital Signs ED Triage Vitals  Encounter Vitals Group     BP 07/24/23 0818 (!) 143/87     Systolic BP Percentile --      Diastolic BP Percentile --      Pulse Rate 07/24/23 0818 63     Resp 07/24/23 0818 16     Temp 07/24/23 0818 97.7 F (36.5 C)     Temp Source 07/24/23 0818 Oral     SpO2 07/24/23 0818 96 %     Weight --  Height --      Head Circumference --      Peak Flow --      Pain Score 07/24/23 0817 2     Pain Loc --      Pain Education --      Exclude from Growth Chart --    No data found.  Updated Vital Signs BP (!) 143/87 (BP Location: Right Arm)   Pulse 63   Temp 97.7 F (36.5 C) (Oral)   Resp 16   SpO2 96%   Visual Acuity Right Eye Distance:   Left Eye Distance:   Bilateral Distance:    Right Eye Near:   Left Eye Near:    Bilateral Near:     Physical Exam Vitals reviewed.  Constitutional:      General: He is not in acute distress.    Appearance: He is not ill-appearing, toxic-appearing or diaphoretic.  Skin:    Coloration: Skin is not pale.     Comments: There is erythema and induration in area about 2 cm in diameter with 2-3 satellite lesions that are about 3 to 4 mm in diameter.  There is no fluctuance.  This is on the lower abdomen just above his pubic bone.  Neurological:     Mental Status: He is alert and oriented to person, place,  and time.  Psychiatric:        Behavior: Behavior normal.      UC Treatments / Results  Labs (all labs ordered are listed, but only abnormal results are displayed) Labs Reviewed - No data to display  EKG   Radiology No results found.  Procedures Procedures (including critical care time)  Medications Ordered in UC Medications - No data to display  Initial Impression / Assessment and Plan / UC Course  I have reviewed the triage vital signs and the nursing notes.  Pertinent labs & imaging results that were available during my care of the patient were reviewed by me and considered in my medical decision making (see chart for details).     Bactrim is sent in to treat.  This seemed to do well for him about 2 years ago.  Warm compresses to the area.  He will follow-up with his primary care  I have asked him to stop doing the ointment in his nose.  I have discussed with him that this is meant to clear the carriage of staph in your nose and is not a treatment for cellulitis elsewhere in the body. Final Clinical Impressions(s) / UC Diagnoses   Final diagnoses:  Cellulitis of abdominal wall     Discharge Instructions      Take sulfamethoxazole-trimethoprim 800-160 mg--1 tablet 2 times daily for 7 days.  Warm compresses to the area.  Please follow-up with your primary care about this issue       ED Prescriptions     Medication Sig Dispense Auth. Provider   sulfamethoxazole-trimethoprim (BACTRIM DS) 800-160 MG tablet Take 1 tablet by mouth 2 (two) times daily for 7 days. 14 tablet Eriq Hufford, Janace Aris, MD      PDMP not reviewed this encounter.   Zenia Resides, MD 07/24/23 [phone]
